# Patient Record
Sex: Female | Born: 1970 | Race: Black or African American | Hispanic: No | Marital: Single | Smoking: Never smoker
Health system: Southern US, Community
[De-identification: ages and names within clinical notes are randomized; demographics above are authoritative.]

---

## 1997-05-19 ENCOUNTER — Inpatient Hospital Stay (HOSPITAL_COMMUNITY): Admission: AD | Admit: 1997-05-19 | Discharge: 1997-05-21 | Payer: Self-pay | Admitting: Obstetrics and Gynecology

## 1997-09-28 ENCOUNTER — Encounter: Admission: RE | Admit: 1997-09-28 | Discharge: 1997-09-28 | Payer: Self-pay | Admitting: Family Medicine

## 1997-11-06 ENCOUNTER — Encounter: Admission: RE | Admit: 1997-11-06 | Discharge: 1997-11-06 | Payer: Self-pay | Admitting: Sports Medicine

## 1997-12-20 ENCOUNTER — Encounter: Admission: RE | Admit: 1997-12-20 | Discharge: 1997-12-20 | Payer: Self-pay | Admitting: Family Medicine

## 1998-01-21 ENCOUNTER — Encounter: Admission: RE | Admit: 1998-01-21 | Discharge: 1998-01-21 | Payer: Self-pay | Admitting: Family Medicine

## 1998-10-18 ENCOUNTER — Encounter: Admission: RE | Admit: 1998-10-18 | Discharge: 1998-10-18 | Payer: Self-pay | Admitting: Family Medicine

## 1998-10-18 ENCOUNTER — Other Ambulatory Visit: Admission: RE | Admit: 1998-10-18 | Discharge: 1998-10-18 | Payer: Self-pay

## 1998-10-24 ENCOUNTER — Encounter: Admission: RE | Admit: 1998-10-24 | Discharge: 1998-10-24 | Payer: Self-pay | Admitting: Family Medicine

## 1998-10-25 ENCOUNTER — Encounter: Admission: RE | Admit: 1998-10-25 | Discharge: 1998-10-25 | Payer: Self-pay | Admitting: Family Medicine

## 1998-10-30 ENCOUNTER — Encounter: Admission: RE | Admit: 1998-10-30 | Discharge: 1998-10-30 | Payer: Self-pay | Admitting: Family Medicine

## 1998-10-31 ENCOUNTER — Encounter: Admission: RE | Admit: 1998-10-31 | Discharge: 1998-10-31 | Payer: Self-pay | Admitting: Family Medicine

## 1999-02-10 ENCOUNTER — Encounter: Admission: RE | Admit: 1999-02-10 | Discharge: 1999-02-10 | Payer: Self-pay | Admitting: Family Medicine

## 1999-02-20 ENCOUNTER — Encounter: Admission: RE | Admit: 1999-02-20 | Discharge: 1999-02-20 | Payer: Self-pay | Admitting: Family Medicine

## 1999-04-17 ENCOUNTER — Encounter: Admission: RE | Admit: 1999-04-17 | Discharge: 1999-04-17 | Payer: Self-pay | Admitting: Family Medicine

## 1999-04-29 ENCOUNTER — Encounter: Admission: RE | Admit: 1999-04-29 | Discharge: 1999-04-29 | Payer: Self-pay | Admitting: Sports Medicine

## 1999-05-16 ENCOUNTER — Encounter: Admission: RE | Admit: 1999-05-16 | Discharge: 1999-05-16 | Payer: Self-pay | Admitting: Family Medicine

## 1999-05-19 ENCOUNTER — Encounter: Admission: RE | Admit: 1999-05-19 | Discharge: 1999-05-19 | Payer: Self-pay | Admitting: Family Medicine

## 1999-06-03 ENCOUNTER — Encounter: Admission: RE | Admit: 1999-06-03 | Discharge: 1999-06-03 | Payer: Self-pay | Admitting: Sports Medicine

## 2003-12-13 ENCOUNTER — Inpatient Hospital Stay (HOSPITAL_COMMUNITY): Admission: AD | Admit: 2003-12-13 | Discharge: 2003-12-13 | Payer: Self-pay | Admitting: Obstetrics & Gynecology

## 2003-12-20 ENCOUNTER — Inpatient Hospital Stay (HOSPITAL_COMMUNITY): Admission: AD | Admit: 2003-12-20 | Discharge: 2003-12-20 | Payer: Self-pay | Admitting: *Deleted

## 2008-05-15 ENCOUNTER — Ambulatory Visit: Payer: Self-pay | Admitting: Family Medicine

## 2008-05-15 ENCOUNTER — Encounter: Payer: Self-pay | Admitting: Family Medicine

## 2008-05-15 LAB — CONVERTED CEMR LAB
Antibody Screen: NEGATIVE
Band Neutrophils: 0 % (ref 0–10)
Basophils Absolute: 0 10*3/uL (ref 0.0–0.1)
Basophils Relative: 0 % (ref 0–1)
Beta hcg, urine, semiquantitative: POSITIVE
Eosinophils Absolute: 0.1 10*3/uL (ref 0.0–0.7)
Eosinophils Relative: 1 % (ref 0–5)
HCT: 38.2 % (ref 36.0–46.0)
Hemoglobin: 12.8 g/dL (ref 12.0–15.0)
Hepatitis B Surface Ag: NEGATIVE
Lymphocytes Relative: 27 % (ref 12–46)
Lymphs Abs: 1.6 10*3/uL (ref 0.7–4.0)
MCHC: 33.5 g/dL (ref 30.0–36.0)
MCV: 87.2 fL (ref 78.0–100.0)
Monocytes Absolute: 0.5 10*3/uL (ref 0.1–1.0)
Monocytes Relative: 9 % (ref 3–12)
Neutro Abs: 3.7 10*3/uL (ref 1.7–7.7)
Neutrophils Relative %: 63 % (ref 43–77)
Platelets: 220 10*3/uL (ref 150–400)
RBC: 4.38 M/uL (ref 3.87–5.11)
RDW: 12.4 % (ref 11.5–15.5)
Rh Type: NEGATIVE
Rubella: 49.9 intl units/mL — ABNORMAL HIGH
Sickle Cell Screen: NEGATIVE
WBC: 5.8 10*3/uL (ref 4.0–10.5)

## 2008-05-16 ENCOUNTER — Ambulatory Visit: Payer: Self-pay | Admitting: Physician Assistant

## 2008-05-16 ENCOUNTER — Encounter: Payer: Self-pay | Admitting: Family Medicine

## 2008-05-16 ENCOUNTER — Inpatient Hospital Stay (HOSPITAL_COMMUNITY): Admission: AD | Admit: 2008-05-16 | Discharge: 2008-05-16 | Payer: Self-pay | Admitting: Obstetrics & Gynecology

## 2008-05-31 ENCOUNTER — Ambulatory Visit: Payer: Self-pay | Admitting: Family Medicine

## 2008-05-31 ENCOUNTER — Encounter: Payer: Self-pay | Admitting: Family Medicine

## 2008-06-01 ENCOUNTER — Ambulatory Visit (HOSPITAL_COMMUNITY): Admission: RE | Admit: 2008-06-01 | Discharge: 2008-06-01 | Payer: Self-pay | Admitting: Family Medicine

## 2008-06-01 ENCOUNTER — Encounter: Payer: Self-pay | Admitting: Family Medicine

## 2008-06-03 ENCOUNTER — Telehealth: Payer: Self-pay | Admitting: Family Medicine

## 2008-06-03 LAB — CONVERTED CEMR LAB: hCG, Beta Chain, Quant, S: 136345.3 milliintl units/mL

## 2008-06-06 ENCOUNTER — Telehealth: Payer: Self-pay | Admitting: Family Medicine

## 2008-06-18 ENCOUNTER — Encounter: Payer: Self-pay | Admitting: Family Medicine

## 2008-07-04 ENCOUNTER — Encounter: Payer: Self-pay | Admitting: Family Medicine

## 2008-07-04 DIAGNOSIS — O360990 Maternal care for other rhesus isoimmunization, unspecified trimester, not applicable or unspecified: Secondary | ICD-10-CM | POA: Insufficient documentation

## 2008-07-05 ENCOUNTER — Encounter: Payer: Self-pay | Admitting: Family Medicine

## 2008-07-05 ENCOUNTER — Ambulatory Visit: Payer: Self-pay | Admitting: Family Medicine

## 2008-07-24 ENCOUNTER — Telehealth (INDEPENDENT_AMBULATORY_CARE_PROVIDER_SITE_OTHER): Payer: Self-pay | Admitting: *Deleted

## 2008-07-27 ENCOUNTER — Encounter: Payer: Self-pay | Admitting: Family Medicine

## 2008-07-27 ENCOUNTER — Ambulatory Visit (HOSPITAL_COMMUNITY): Admission: RE | Admit: 2008-07-27 | Discharge: 2008-07-27 | Payer: Self-pay | Admitting: Family Medicine

## 2008-07-30 ENCOUNTER — Telehealth: Payer: Self-pay | Admitting: Family Medicine

## 2008-07-30 ENCOUNTER — Inpatient Hospital Stay (HOSPITAL_COMMUNITY): Admission: AD | Admit: 2008-07-30 | Discharge: 2008-07-30 | Payer: Self-pay | Admitting: Obstetrics & Gynecology

## 2008-08-02 ENCOUNTER — Ambulatory Visit: Payer: Self-pay | Admitting: Family Medicine

## 2008-08-21 ENCOUNTER — Telehealth: Payer: Self-pay | Admitting: Family Medicine

## 2008-08-22 ENCOUNTER — Telehealth: Payer: Self-pay | Admitting: Family Medicine

## 2008-08-22 ENCOUNTER — Encounter (INDEPENDENT_AMBULATORY_CARE_PROVIDER_SITE_OTHER): Payer: Self-pay | Admitting: Family Medicine

## 2008-08-22 ENCOUNTER — Ambulatory Visit: Payer: Self-pay | Admitting: Family Medicine

## 2008-08-22 ENCOUNTER — Encounter: Payer: Self-pay | Admitting: Family Medicine

## 2008-08-22 LAB — CONVERTED CEMR LAB
Antibody Screen: NEGATIVE
Whiff Test: NEGATIVE

## 2008-08-30 ENCOUNTER — Ambulatory Visit: Payer: Self-pay | Admitting: Family Medicine

## 2008-09-07 ENCOUNTER — Encounter: Payer: Self-pay | Admitting: Family Medicine

## 2008-09-27 ENCOUNTER — Ambulatory Visit: Payer: Self-pay | Admitting: Family Medicine

## 2008-10-05 ENCOUNTER — Encounter: Payer: Self-pay | Admitting: Family Medicine

## 2008-10-20 ENCOUNTER — Inpatient Hospital Stay (HOSPITAL_COMMUNITY): Admission: AD | Admit: 2008-10-20 | Discharge: 2008-10-20 | Payer: Self-pay | Admitting: Obstetrics & Gynecology

## 2008-10-24 ENCOUNTER — Ambulatory Visit: Payer: Self-pay | Admitting: Family Medicine

## 2008-10-24 ENCOUNTER — Encounter: Payer: Self-pay | Admitting: Family Medicine

## 2008-10-24 LAB — CONVERTED CEMR LAB: Whiff Test: NEGATIVE

## 2008-11-08 ENCOUNTER — Encounter: Payer: Self-pay | Admitting: Family Medicine

## 2008-11-08 ENCOUNTER — Ambulatory Visit: Payer: Self-pay | Admitting: Family Medicine

## 2008-11-08 ENCOUNTER — Encounter: Payer: Self-pay | Admitting: *Deleted

## 2008-11-09 ENCOUNTER — Encounter: Payer: Self-pay | Admitting: Family Medicine

## 2008-11-09 ENCOUNTER — Ambulatory Visit (HOSPITAL_COMMUNITY): Admission: RE | Admit: 2008-11-09 | Discharge: 2008-11-09 | Payer: Self-pay | Admitting: Family Medicine

## 2008-11-22 ENCOUNTER — Ambulatory Visit: Payer: Self-pay | Admitting: Family Medicine

## 2008-11-23 ENCOUNTER — Encounter: Payer: Self-pay | Admitting: Family Medicine

## 2008-11-26 ENCOUNTER — Ambulatory Visit: Payer: Self-pay | Admitting: Family Medicine

## 2008-11-26 DIAGNOSIS — Z22330 Carrier of Group B streptococcus: Secondary | ICD-10-CM | POA: Insufficient documentation

## 2008-11-26 DIAGNOSIS — M5430 Sciatica, unspecified side: Secondary | ICD-10-CM | POA: Insufficient documentation

## 2008-11-26 LAB — CONVERTED CEMR LAB
Bilirubin Urine: NEGATIVE
Blood in Urine, dipstick: NEGATIVE
Glucose, Urine, Semiquant: NEGATIVE
Ketones, urine, test strip: NEGATIVE
Nitrite: NEGATIVE
Protein, U semiquant: NEGATIVE
Specific Gravity, Urine: 1.01
Urobilinogen, UA: 0.2
WBC Urine, dipstick: NEGATIVE
pH: 7

## 2008-12-06 ENCOUNTER — Ambulatory Visit: Payer: Self-pay | Admitting: Family Medicine

## 2008-12-09 ENCOUNTER — Inpatient Hospital Stay (HOSPITAL_COMMUNITY): Admission: AD | Admit: 2008-12-09 | Discharge: 2008-12-09 | Payer: Self-pay | Admitting: Obstetrics & Gynecology

## 2008-12-11 ENCOUNTER — Ambulatory Visit: Payer: Self-pay | Admitting: Family Medicine

## 2008-12-19 ENCOUNTER — Inpatient Hospital Stay (HOSPITAL_COMMUNITY): Admission: AD | Admit: 2008-12-19 | Discharge: 2008-12-21 | Payer: Self-pay | Admitting: Obstetrics & Gynecology

## 2008-12-19 ENCOUNTER — Telehealth: Payer: Self-pay | Admitting: Sports Medicine

## 2008-12-19 ENCOUNTER — Ambulatory Visit: Payer: Self-pay | Admitting: Advanced Practice Midwife

## 2009-01-07 ENCOUNTER — Ambulatory Visit: Payer: Self-pay | Admitting: Family Medicine

## 2009-01-07 ENCOUNTER — Telehealth: Payer: Self-pay | Admitting: Family Medicine

## 2009-01-07 LAB — CONVERTED CEMR LAB: Rapid Strep: NEGATIVE

## 2009-01-25 ENCOUNTER — Encounter: Payer: Self-pay | Admitting: Family Medicine

## 2009-01-25 ENCOUNTER — Ambulatory Visit: Payer: Self-pay | Admitting: Family Medicine

## 2009-01-25 DIAGNOSIS — M898X9 Other specified disorders of bone, unspecified site: Secondary | ICD-10-CM | POA: Insufficient documentation

## 2009-01-25 LAB — CONVERTED CEMR LAB: Beta hcg, urine, semiquantitative: NEGATIVE

## 2009-03-11 ENCOUNTER — Encounter (INDEPENDENT_AMBULATORY_CARE_PROVIDER_SITE_OTHER): Payer: Self-pay | Admitting: *Deleted

## 2009-03-14 ENCOUNTER — Telehealth: Payer: Self-pay | Admitting: Family Medicine

## 2009-03-15 ENCOUNTER — Encounter: Payer: Self-pay | Admitting: Family Medicine

## 2009-03-18 ENCOUNTER — Ambulatory Visit: Payer: Self-pay | Admitting: Family Medicine

## 2009-03-22 ENCOUNTER — Ambulatory Visit: Payer: Self-pay | Admitting: Family Medicine

## 2009-05-07 ENCOUNTER — Telehealth: Payer: Self-pay | Admitting: Family Medicine

## 2009-08-01 ENCOUNTER — Ambulatory Visit: Payer: Self-pay | Admitting: Family Medicine

## 2009-10-07 LAB — CONVERTED CEMR LAB
HCT: 40.8 % (ref 36.0–46.0)
Hemoglobin: 13.6 g/dL (ref 12.0–15.0)
MCHC: 33.3 g/dL (ref 30.0–36.0)
MCV: 89.7 fL (ref 78.0–100.0)
Platelets: 249 10*3/uL (ref 150–400)
RBC: 4.55 M/uL (ref 3.87–5.11)
RDW: 12.5 % (ref 11.5–15.5)
TSH: 1.14 microintl units/mL (ref 0.350–4.500)
WBC: 4.1 10*3/uL (ref 4.0–10.5)

## 2010-03-04 ENCOUNTER — Encounter: Payer: Self-pay | Admitting: Family Medicine

## 2010-04-08 NOTE — Progress Notes (Signed)
Summary: triage  Phone Note Call from Patient Call back at Home Phone [phone]   Caller: Patient Summary of Call: Thinks she has thrush on her breast. She breast feeds. Initial call taken by: Clydell Hakim,  March 14, 2009 10:26 AM  Follow-up for Phone Call        states her nipples &  breasts itch & thinks she has thrush. the area is irritated & she wants to know if she can use the nystatin that was rx for her dtr's thrush. told her I will check & call her back Follow-up by: Golden Circle RN,  March 14, 2009 10:30 AM  Additional Follow-up for Phone Call Additional follow up Details #1::        child is 10 weeks old & latches well. this is mom's 4th child.   per Dr. Mauricio Po, she may use the nystatin. if no improvement in 1-2 days, call for appt as there are other things that may be causing the problem. mom agrees Additional Follow-up by: Golden Circle RN,  March 14, 2009 10:34 AM

## 2010-04-08 NOTE — Assessment & Plan Note (Signed)
Summary: rash all over,tcb   Vital Signs:  Patient profile:   40 year old female Weight:      173 pounds Temp:     98.4 degrees F oral Pulse rate:   71 / minute BP sitting:   113 / 64  (right arm) Cuff size:   regular  Vitals Entered By: Tessie Fass CMA (March 22, 2009 11:38 AM) CC: body rash, itching Is Patient Diabetic? No Pain Assessment Patient in pain? no        Primary Care Provider:  Milinda Antis MD  CC:  body rash and itching.  History of Present Illness: Cynthia Greene comes in today for itching.  She was recently treated for candidiasis of her breast.  Her daughter, whom she is breastfeeding, had oral candidiasis.  Both are being treated now.  The yeast on her breast is better.  Now just struggling with the cracking and dryness.  The lanolin made her itch worse so she is using eucerin.  Also itching on her face now and inner thighs.  Has red bumps in these areas.  Feels "itchy" all over but no bumps anywhere else.  Worried she is "infested" with yeast.  No fevers.  No further pus on breasts.  Habits & Providers  Alcohol-Tobacco-Diet     Tobacco Status: never  Allergies: No Known Drug Allergies  Physical Exam  General:  Well-developed, well-nourished, in no acute distress; alert, appropriate and cooperative throughout examination. Vitals reviewed. Breasts:   dry /flaky skin aroud nipple.  breast non-tender to palpation no fluctuant area.  Mild erythema of left breast around 10 o'clock but no warmth or pain.  Is itchy to patient Skin:  small bumps around eyes on face.  small erythematous bumps on inner upper thighs with evidence of excoriation.   Impression & Recommendations:  Problem # 1:  CANDIDIASIS, SKIN (ICD-112.3) Assessment Improved  Improving on breast.  CAn continue topical nystatin and eucerin.  Do not feel rash on thighs or face is yeast.  Unclear etiology.  Appears like nonspecific dermatitis.  Will try triamcinolone cream to see if this helps.   Patient feels (and I agree) may be some psychological aspect to the itching since she has been so worried about the yeast.  Overall though, endorses all rashes and itching seem to be improving.   Orders: [facility]- Est Level  3 (78295)  Complete Medication List: 1)  Multivitamins Tabs (Multiple vitamin) .Marland Kitchen.. 1 chewable mvi daily 2)  Folic Acid 400 Mcg Tabs (Folic acid) .Marland Kitchen.. 1 by mouth daily 3)  P D Natal Vitamins/folic Acid Tabs (Prenatal multivit-min-fe-fa) .... Take 1 daily during the pregnancy and while breastfeeding 4)  Hydrocortisone 2.5 % Crea (Hydrocortisone) .... Apply to affected areas three times a day as needed 5)  Vicodin 5-500 Mg Tabs (Hydrocodone-acetaminophen) .... Take one by mouth q6 hours as needed headache 6)  Lansinoh/breastfeeding Mothers Oint (Lanolin) .... Apply to affected areas on breast twice a day  dispense 1 large bottle 7)  Triamcinolone Acetonide 0.5 % Crea (Triamcinolone acetonide) .... Apply to affected areas on body (not face) twice daily for itching or irritation disp: 80g Prescriptions: TRIAMCINOLONE ACETONIDE 0.5 % CREA (TRIAMCINOLONE ACETONIDE) apply to affected areas on body (not face) twice daily for itching or irritation disp: 80g  #1 x 3   Entered and Authorized by:   Ardeen Garland  MD   Signed by:   Ardeen Garland  MD on 03/22/2009   Method used:   [facility] to  [facility]  [address] [id number]       [address]       [place], [place]  [address]       Ph: [phone]       Fax: [phone]   RxID:   [id number]

## 2010-04-08 NOTE — Miscellaneous (Signed)
Summary: pus on nipples  Clinical Lists Changes states she has been using the nystatin but they hurt & now has pus coming our from areola. advised UC tonight. we close in a few minutes & cannot see her. she was ok with this.Golden Circle RN  March 15, 2009 4:45 PM

## 2010-04-08 NOTE — Progress Notes (Signed)
Summary: phn msg  Phone Note Call from Patient Call back at Home Phone [phone]   Caller: Patient Summary of Call: Wants to talk to Dr. [place] about a medical issue that they had discussed before. Initial call taken by: Clydell Hakim,  May 07, 2009 9:15 AM  Follow-up for Phone Call        fwd. to dr.jordan Follow-up by: Arlyss Repress CMA,,  May 07, 2009 12:11 PM  Additional Follow-up for Phone Call Additional follow up Details #1::        Pt's grandmother has lung cancer - pt wanted to discuss treatment options. Additional Follow-up by: Sarah [place] MD,  May 08, 2009 8:36 AM

## 2010-04-08 NOTE — Miscellaneous (Signed)
Summary: [name]  Clinical Lists Changes  Problems: Added new problem of [name] (ICD-305.1) 

## 2010-04-08 NOTE — Assessment & Plan Note (Signed)
Summary: having OB prob,df   Vital Signs:  Patient profile:   40 year old female Weight:      171 pounds Temp:     98.5 degrees F oral Pulse rate:   91 / minute Pulse rhythm:   regular BP sitting:   109 / 73  (right arm)  Vitals Entered By: Modesta Messing LPN (November 26, 2008 11:13 AM) CC: c/o pressure and pain in leg. Is Patient Diabetic? No   Primary Care Provider:  Milinda Antis MD  CC:  c/o pressure and pain in leg.Marland Kitchen  History of Present Illness: 40 yo multip at 35/[redacted] weeks GA, with PMHx of preterm labor and one preterm delivery at 36 weeks, presents with c/o (1) contractions, and (2) sacral pain with burning and shooting pain down the left leg.   1. Contractions: x 5 months?, unchanged, pt with history of pre-term labor req tocolytics and 1 preterm delivery,  declined 17-P. denies bleeding or fluid from vagina.   2. Sacral pain with burning and shooting pain down back of left leg. x 6 weeks, but much worse last night, sleeps on side (whichever is more comfortable) with bobby body pillow. no bowel or bladder incontinence or saddle anesthesia. does not want medication.  Note: starting wearing new shoes about 1 month ago that are designed to strengthen hamstrings and glutes. she admits to walking a lot yesterday.   Habits & Providers  Alcohol-Tobacco-Diet     Alcohol drinks/day: 0     Tobacco Status: never     Cigarette Packs/Day: n/a  Current Medications (verified): 1)  Multivitamins  Tabs (Multiple Vitamin) .Marland Kitchen.. 1 Chewable Mvi Daily 2)  Folic Acid 400 Mcg Tabs (Folic Acid) .Marland Kitchen.. 1 By Mouth Daily 3)  P D Natal Vitamins/folic Acid  Tabs (Prenatal Multivit-Min-Fe-Fa) .... Take 1 Daily During The Pregnancy and While Breastfeeding 4)  Hydrocortisone 2.5 % Crea (Hydrocortisone) .... Apply To Affected Areas Three Times A Day As Needed  Allergies (verified): No Known Drug Allergies  Past History:  Social History: Last updated: 05/31/2008 Lives with 3 children, Current  FOB not father of other children. Works as Social worker, attends [employer] Never Smoked Alcohol use-no Drug use-no Seatbelt use 100%  Review of Systems       per HPI, otherwise negative  Physical Exam  General:  Well-developed, well-nourished, in no acute distress; alert, appropriate and cooperative throughout examination. Vitals reviewed. Abdomen:  Gravid. Msk:  Left + SLR. Extremities:  no edema Neurologic:  no focal deficits Psych:  Oriented X3, memory intact for recent and remote, normally interactive, and good eye contact.     Impression & Recommendations:  Problem # 1:  SCIATICA (ICD-724.3) Assessment New  Discussed laxity of ligaments and extra weight on sacrum and pelvis during pregnancy. Instructed patient to lay on unaffected side as much as possible with pillow between knees. Stop wearing shoes designed to strengthen glutes since this could be piriformis syndrome. Tylenol for pain. NO RED FLAGS.   Orders: [facility]- Est Level  3 (16109)  Problem # 2:  PREGNANCY, MULTIGRAVIDA (ICD-V22.1) Assessment: Unchanged  Appropriate growth; intermittent unsustained contractions, no change in cervix.   Orders: [facility]- Est Level  3 (60454)  Complete Medication List: 1)  Multivitamins Tabs (Multiple vitamin) .Marland Kitchen.. 1 chewable mvi daily 2)  Folic Acid 400 Mcg Tabs (Folic acid) .Marland Kitchen.. 1 by mouth daily 3)  P D Natal Vitamins/folic Acid Tabs (Prenatal multivit-min-fe-fa) .... Take 1 daily during the pregnancy and while breastfeeding 4)  Hydrocortisone  2.5 % Crea (Hydrocortisone) .... Apply to affected areas three times a day as needed  Other Orders: Urinalysis-[facility] (00000)  Patient Instructions: 1)  It was nice to meet you today! 2)  Today, your baby has a normal heart rate and your cervix has not changed. 3)  For your sciatica, lay on right side with pillow between your legs. It is safe to take Tylenol for pain.  Laboratory Results   Urine Tests  Date/Time Received: November 26, 2008 12:11 PM  Date/Time Reported: November 26, 2008 12:17 PM   Routine Urinalysis   Color: yellow Appearance: Clear Glucose: negative   (Normal Range: Negative) Bilirubin: negative   (Normal Range: Negative) Ketone: negative   (Normal Range: Negative) Spec. Gravity: 1.010   (Normal Range: 1.003-1.035) Blood: negative   (Normal Range: Negative) pH: 7.0   (Normal Range: 5.0-8.0) Protein: negative   (Normal Range: Negative) Urobilinogen: 0.2   (Normal Range: 0-1) Nitrite: negative   (Normal Range: Negative) Leukocyte Esterace: negative   (Normal Range: Negative)    Comments: ...............test performed by......Marland KitchenBonnie A. [place], [place] (ASCP)       Flowsheet View for Follow-up Visit    Estimated weeks of       gestation:     35 6/7    Weight:     171    Blood pressure:   109 / 73    Urine protein:       negative    Urine glucose:    negative    Urine nitrite:     negative    Hx headache?     No    Nausea/vomiting?   No    Edema?     0    Bleeding?     no    Leakage/discharge?   no    Fetal activity:       yes    Labor symptoms?   few ctx    Fundal height:      35    FHR:       140s    Fetal position:      vertex    Cx dilation:     FT    Cx effacement:   0    Fetal station:     high    Taking Vitamins?   Y    Smoking PPD:   n/a    Comment:     Some ctx, + sciatic pain    Next visit:     1 wk    Resident:     EW    Preceptor:     Jennette Kettle

## 2010-04-08 NOTE — Progress Notes (Signed)
 Summary: Emergency Line Call  Phone Note Call from Patient Call back at Home Phone [phone]   Caller: Patient Call For: Emergency Line Summary of Call: [redacted] week pregnant, feels that water broke just now, felt a gush of fluid that was clear, started contracting q5 mins, baby moving ok, no fevers/chills/abd pain, no vaginal bleeding, advised get to [facility] ASAP.  Pt knows where it is and will go immediately.  MAU resident to eval for ROM and call Dr. Bari if positive. Initial call taken by: Debby Petties MD,  December 19, 2008 4:24 AM

## 2010-04-08 NOTE — Assessment & Plan Note (Signed)
Summary: thrush on breast,tcb   Vital Signs:  Patient profile:   40 year old female Weight:      172 pounds Temp:     98.2 degrees F oral Pulse rate:   81 / minute BP sitting:   127 / 77  (right arm) Cuff size:   regular  Vitals Entered By: Tessie Fass CMA (March 18, 2009 2:08 PM) CC: thrush on breast Is Patient Diabetic? No   Primary Care Provider:  Milinda Antis MD  CC:  thrush on breast.  History of Present Illness:   40 y.o. approx 8 weeks post partrum presents with complaint of thrush on bilat. Breast. Infant recently treated for thrush as well. Called in last week and told to use the nystatin cream on the breast which she has been using, as well as air drying and neosporin. Redness has improved but breast ccontinue to itch and crack. Admits to small white bumps and dry flaky skin on the outer areola. occasional clear fluid from the cracks but no evidence of pus leaking from the nipple. Infant continues to feed well. ROS- denies fever, N/V  Habits & Providers  Alcohol-Tobacco-Diet     Tobacco Status: never  Current Medications (verified): 1)  Multivitamins  Tabs (Multiple Vitamin) .Marland Kitchen.. 1 Chewable Mvi Daily 2)  Folic Acid 400 Mcg Tabs (Folic Acid) .Marland Kitchen.. 1 By Mouth Daily 3)  P D Natal Vitamins/folic Acid  Tabs (Prenatal Multivit-Min-Fe-Fa) .... Take 1 Daily During The Pregnancy and While Breastfeeding 4)  Hydrocortisone 2.5 % Crea (Hydrocortisone) .... Apply To Affected Areas Three Times A Day As Needed 5)  Vicodin 5-500 Mg Tabs (Hydrocodone-Acetaminophen) .... Take One By Mouth Q6 Hours As Needed Headache 6)  Lansinoh/breastfeeding Mothers  Oint (Lanolin) .... Apply To Affected Areas On Breast Twice A Day  Dispense 1 Large Bottle  Allergies (verified): No Known Drug Allergies  Social History: Smoking Status:  never  Physical Exam  General:  Well-developed, well-nourished, in no acute distress; alert, appropriate and cooperative throughout examination. Vitals  reviewed. Breasts:  Minimal erythma bilat surrounding aerola, dry /flaky skin aroud nipple. Breast milk expressed bilat, no pus expressed, breast non-tender to palpation no fluctuant area, small raised papular lesions ? similar to [place] glands located 1cm outside of hyperpigmented areola region.  No adenopathy   Impression & Recommendations:  Problem # 1:  CANDIDIASIS, SKIN (ICD-112.3) Assessment New  Pt to continue Nystatin x 3 days, and start Lanolin for dry cracking nipples. Infant to continue Nystatin x 1 week as to not continue to reinfect mother Orders: [facility]- Est Level  3 (16109)  Problem # 2:  BREAST PAIN, BILATERAL (ICD-611.71) Assessment: New  Secondary to above and dry cracking skin on breast . No evidence of superinfection. Given red flags, if pain worsens or pus presents will need treatment for mastitis . Handout given on Thrush and breastfeeding  Orders: [facility]- Est Level  3 (60454)  Complete Medication List: 1)  Multivitamins Tabs (Multiple vitamin) .Marland Kitchen.. 1 chewable mvi daily 2)  Folic Acid 400 Mcg Tabs (Folic acid) .Marland Kitchen.. 1 by mouth daily 3)  P D Natal Vitamins/folic Acid Tabs (Prenatal multivit-min-fe-fa) .... Take 1 daily during the pregnancy and while breastfeeding 4)  Hydrocortisone 2.5 % Crea (Hydrocortisone) .... Apply to affected areas three times a day as needed 5)  Vicodin 5-500 Mg Tabs (Hydrocodone-acetaminophen) .... Take one by mouth q6 hours as needed headache 6)  Lansinoh/breastfeeding Mothers Oint (Lanolin) .... Apply to affected areas on breast twice a day  dispense 1 large bottle Prescriptions: LANSINOH/BREASTFEEDING MOTHERS  OINT (LANOLIN) apply to affected areas on breast twice a day  dispense 1 large bottle  #1 x 2   Entered and Authorized by:   Milinda Antis MD   Signed by:   Milinda Antis MD on 03/18/2009   Method used:   Print then Give to Patient   RxID:   [id number]

## 2010-04-08 NOTE — Assessment & Plan Note (Signed)
Summary: fluttering in chest,tcb   Vital Signs:  Patient profile:   40 year old female Weight:      187.6 pounds Pulse rate:   78 / minute BP sitting:   120 / 80  (right arm)  Vitals Entered By: Arlyss Repress CMA, (Aug 01, 2009 9:22 AM) CC: feels like heart skips a beat...feels tightness in chest and it takes her breath away, when she feels her chest getting tight. worse over the last weeks. Is Patient Diabetic? No Pain Assessment Patient in pain? no        Primary Care Provider:  Milinda Antis MD  CC:  feels like heart skips a beat...feels tightness in chest and it takes her breath away and when she feels her chest getting tight. worse over the last weeks.Marland Kitchen  History of Present Illness: Pt reports fluttering in chest within 1 month postpartum.  Now happens 1-2 times a week.  Feels she has to take a deep breath because her chest feels tight.  Episodes last seconds and are relieved with moving slower.  Pt notes 30 lb weight gain since before pregnancy and that BP is higher than in the past.  Swellingin toes  happened the other day after sitting for 2 hours.  No orthopnea or PND.  No caffeinated beverages, but lots of chocolate.  + craves sweets.   Active.  Walks a lot.  Feels winded pretty easily.  No tabacco.    Habits & Providers  Alcohol-Tobacco-Diet     Alcohol drinks/day: 0     Tobacco Status: never  Current Medications (verified): 1)  Multivitamins  Tabs (Multiple Vitamin) .Marland Kitchen.. 1 Chewable Mvi Daily  Allergies: No Known Drug Allergies  Review of Systems       see HPI  Physical Exam  General:  Well-developed,well-nourished,in no acute distress; alert,appropriate and cooperative throughout examination Lungs:  Normal respiratory effort, chest expands symmetrically. Lungs are clear to auscultation, no crackles or wheezes. Heart:  Normal rate and regular rhythm. S1 and S2 normal without gallop, murmur, click, rub or other extra sounds.   Impression &  Recommendations:  Problem # 1:  PALPITATIONS (ICD-785.1) Pt asymptomatic today.  EKG reviewed with NSR and no T-wave changes or ST changes.  May be due to pt's increased consumption of chocolate, stress and weight gain.  Encouraged trial of less chocolate.  Increase exercise.  F/u as needed. Orders: [facility]- Est Level  3 (16109)  Complete Medication List: 1)  Multivitamins Tabs (Multiple vitamin) .Marland Kitchen.. 1 chewable mvi daily  Other Orders: EKG- [facility] (EKG) TSH-[facility] ([id number]) CBC-[facility] (91478)  Patient Instructions: 1)  Things look good with your exam and EKG.  We will check some blood work today. 2)  Let us know if you feel worse.   3)  It was great to see you today

## 2010-04-10 NOTE — Miscellaneous (Signed)
  Clinical Lists Changes  Problems: Removed problem of PALPITATIONS (ICD-785.1) Removed problem of IRREGULAR HEART RATE (ICD-427.9) Removed problem of BREAST PAIN, BILATERAL (ICD-611.71) Removed problem of CANDIDIASIS, SKIN (ICD-112.3) Removed problem of POSTPARTUM EXAMINATION, NORMAL (ICD-V24.2) Removed problem of CONTRACEPTIVE MANAGEMENT (ICD-V25.09) Removed problem of HEADACHE (ICD-784.0) Removed problem of ABDOMINAL PAIN (ICD-789.00) Removed problem of CONTACT OR EXPOSURE TO OTHER VIRAL DISEASES (ICD-V01.79) Removed problem of VAGINAL BLEEDING IN PREGNANCY (ICD-641.90) Removed problem of VAGINAL DISCHARGE (ICD-623.5) Removed problem of PREGNANCY, MULTIGRAVIDA (ICD-V22.1) Removed problem of ADVANCED MATERNAL AGE (ICD-659.60) Removed problem of PREGNANT STATE, INCIDENTAL (ICD-V22.2) Removed problem of SCREENING FOR MALIGNANT NEOPLASM OF THE CERVIX (ICD-V76.2) Removed problem of SUPERVISION OF OTHER NORMAL PREGNANCY (ICD-V22.1)

## 2010-05-21 IMAGING — US US OB FOLLOW-UP
1 series · 14 of 28 positions shown · non-contrast
Comparison: none

OBSTETRICAL ULTRASOUND:
 This ultrasound exam was performed in the [HOSPITAL] Ultrasound Department.  The OB US report was generated in the AS system, and faxed to the ordering physician.  This report is also available in [REDACTED] PACS.

[Series 1: us ob follow up · 14 of 46 slices shown]
[im 2/46]
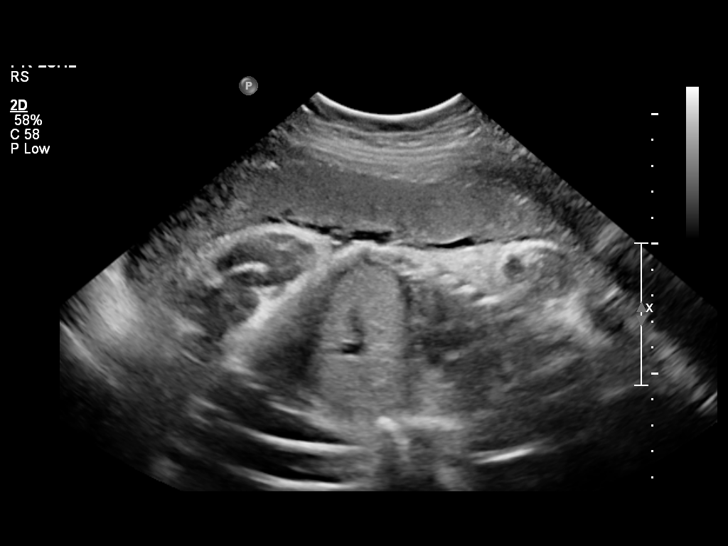
[im 6/46]
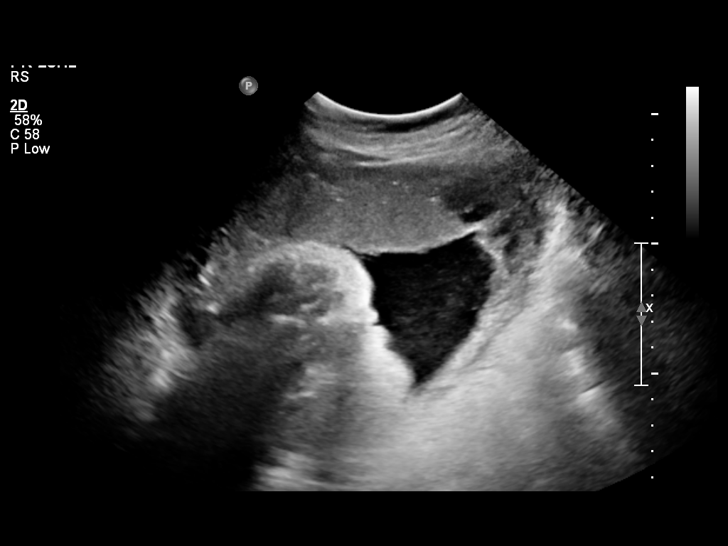
[im 9/46]
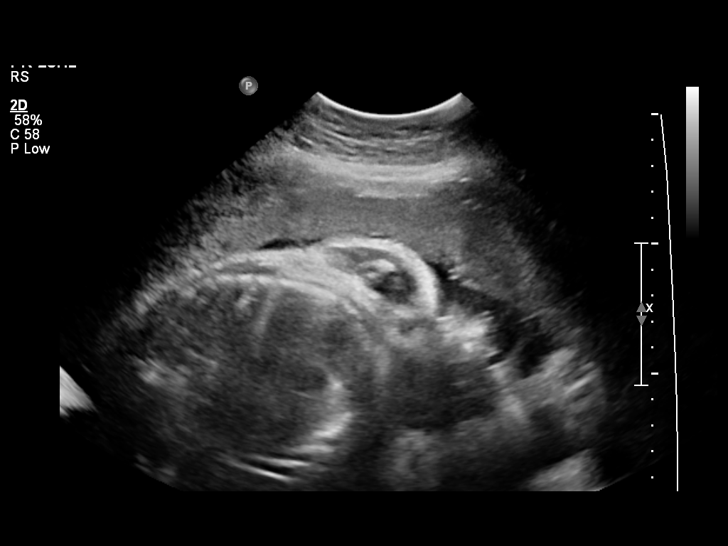
[im 12/46]
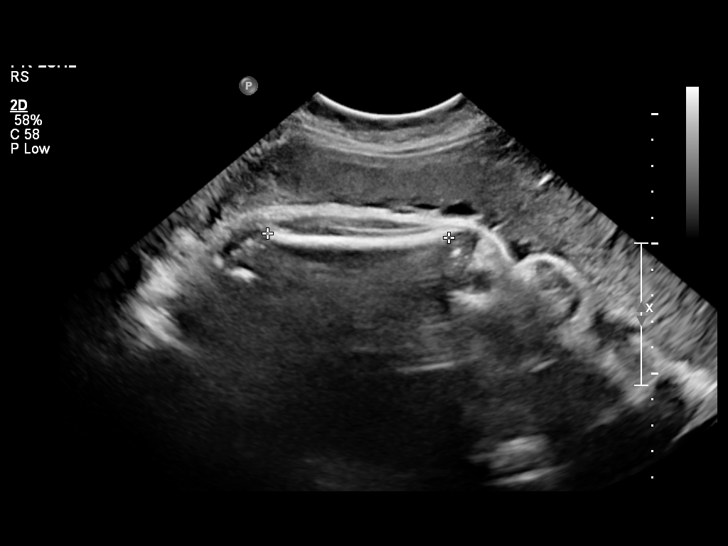
[im 16/46]
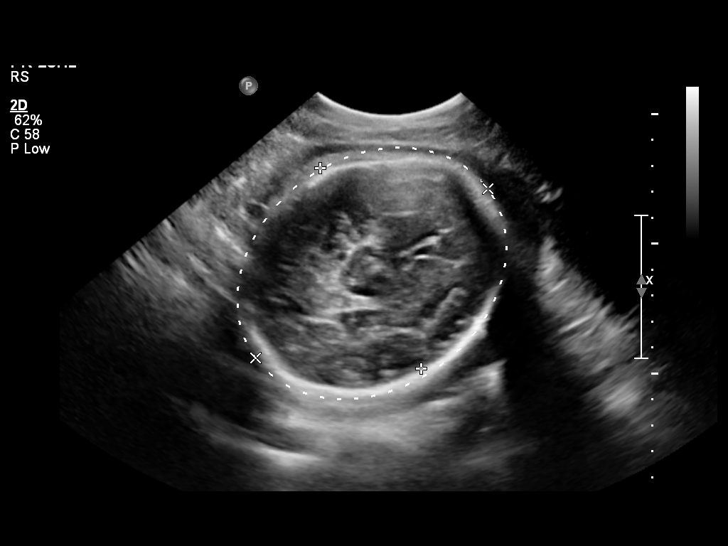
[im 19/46]
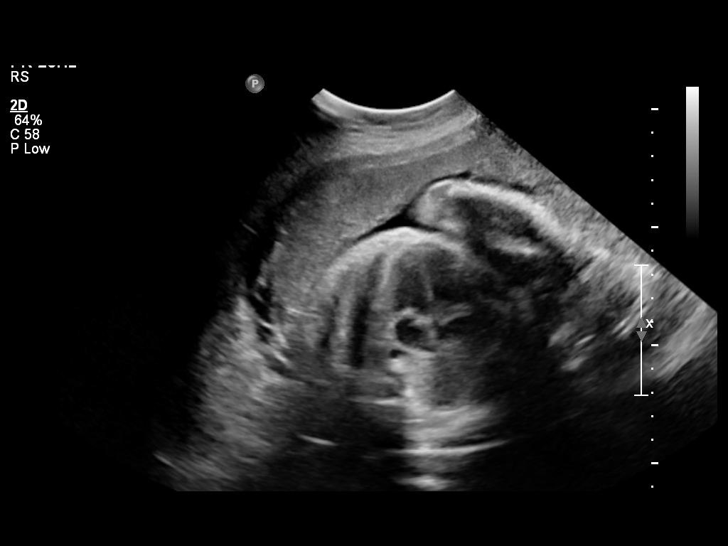
[im 22/46]
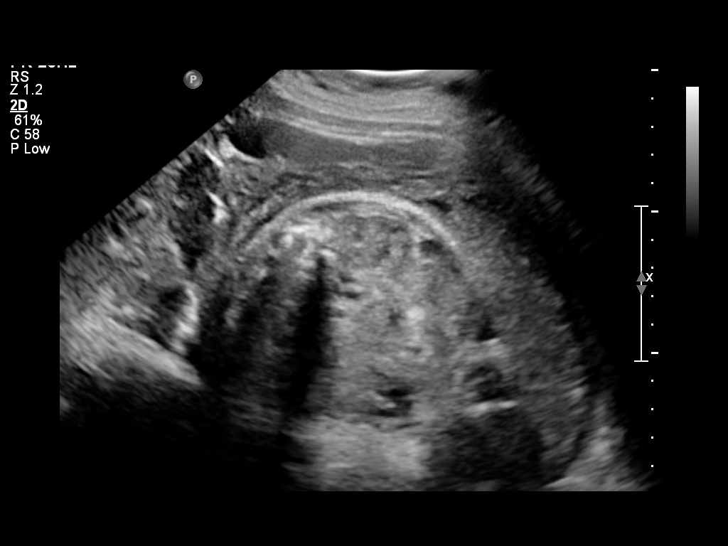
[im 26/46]
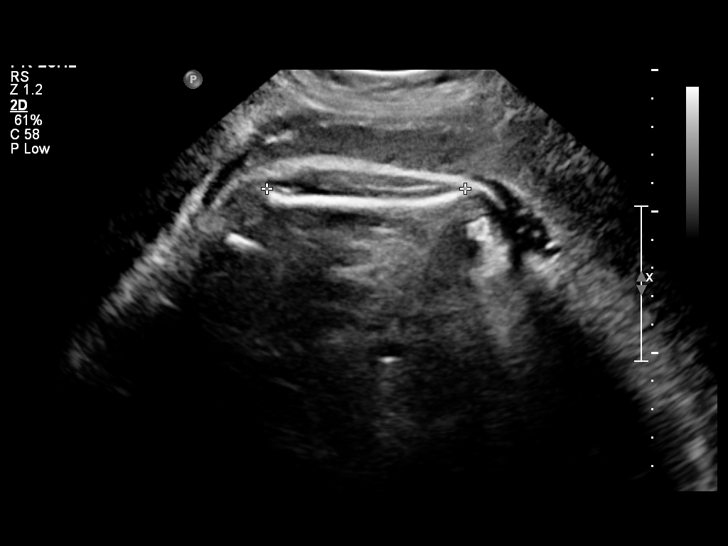
[im 29/46]
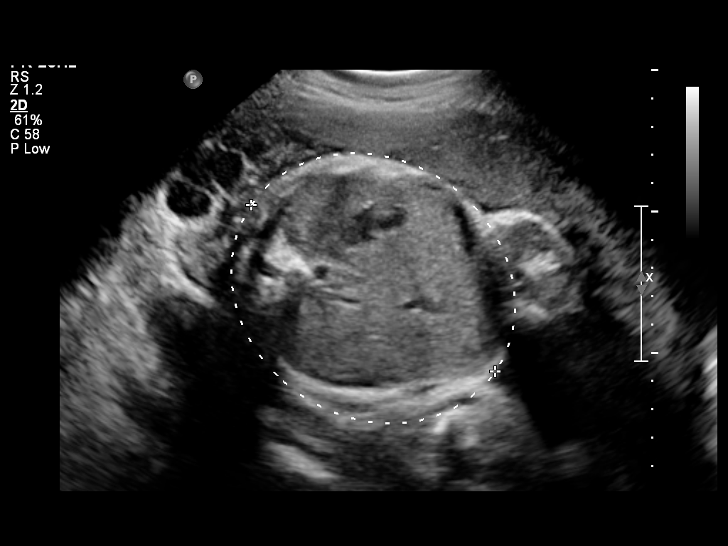
[im 32/46]
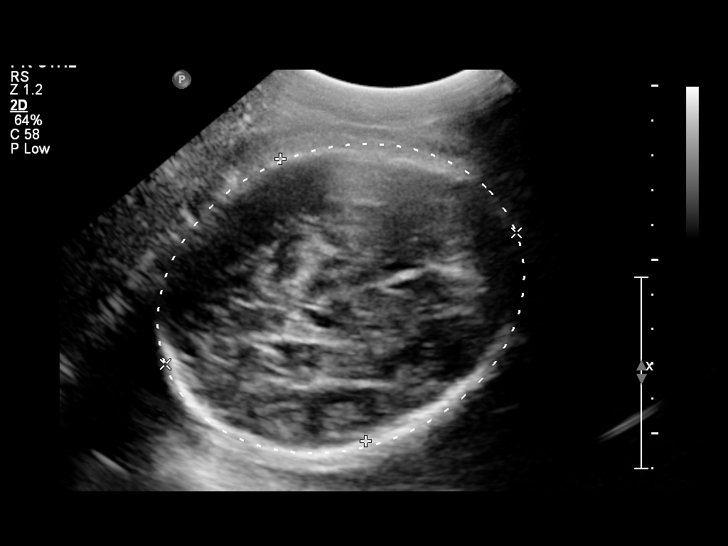
[im 36/46]
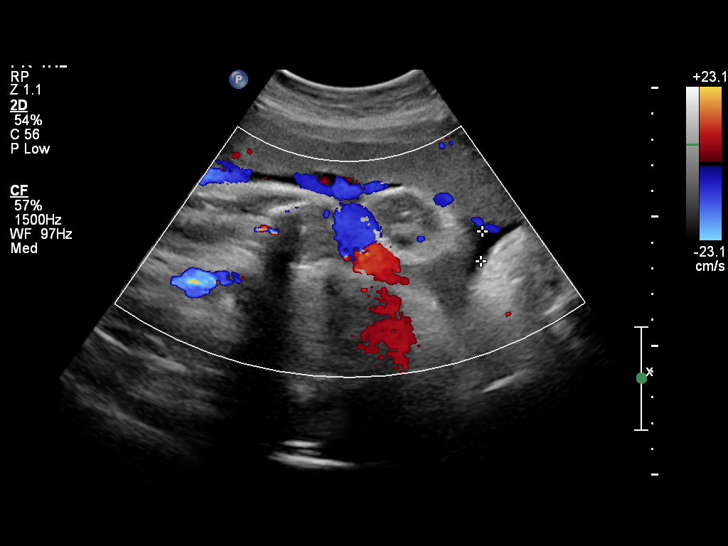
[im 39/46]
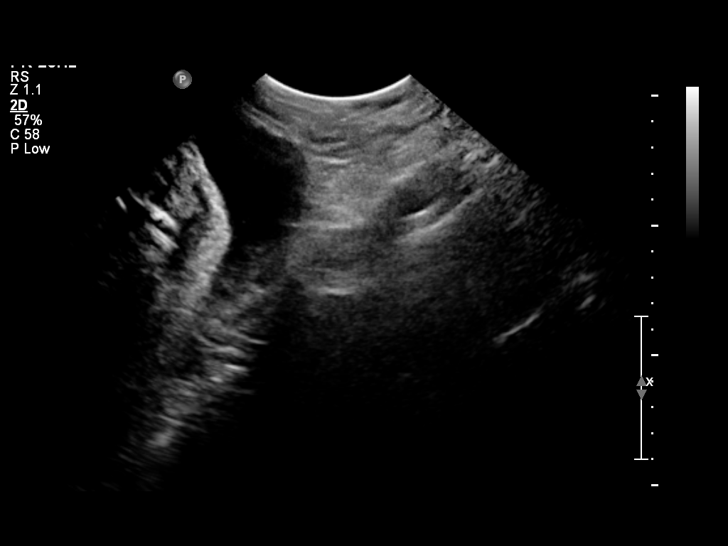
[im 42/46]
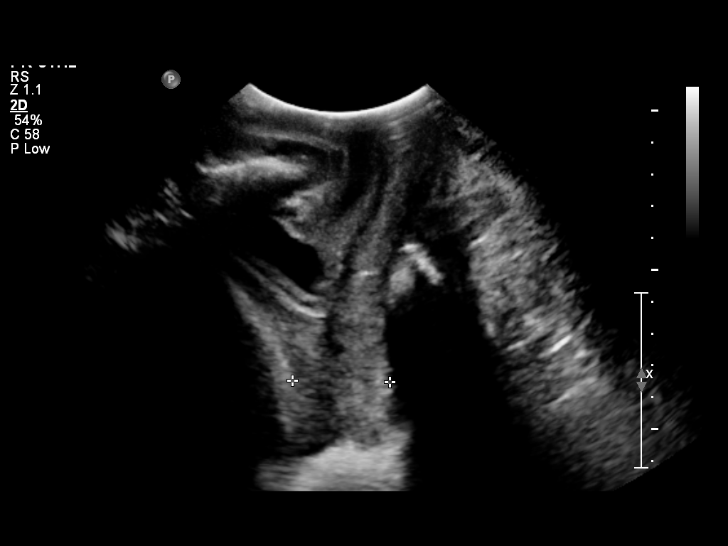
[im 46/46]
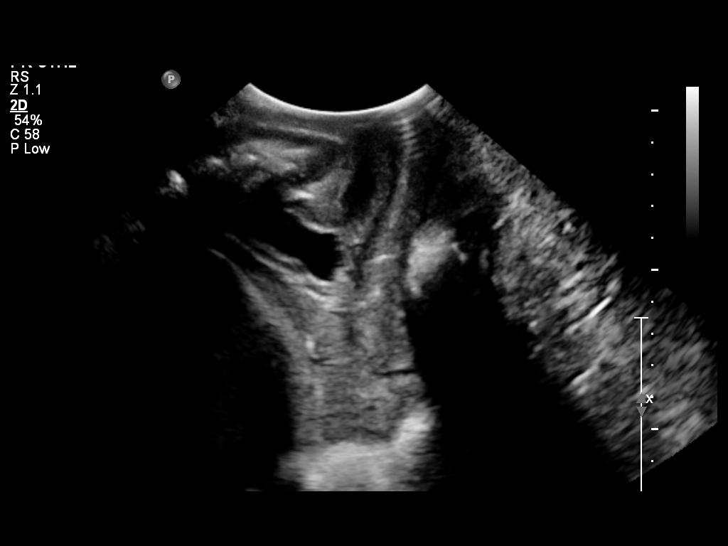

[14 of 28 positions shown; findings below may reference images not displayed]

IMPRESSION: See AS Obstetric US report.

## 2010-06-12 LAB — RH IMMUNE GLOB WKUP(>/=20WKS)(NOT WOMEN'S HOSP): Fetal Screen: NEGATIVE

## 2010-06-12 LAB — CBC
HCT: 32.7 % — ABNORMAL LOW (ref 36.0–46.0)
Hemoglobin: 11.1 g/dL — ABNORMAL LOW (ref 12.0–15.0)
MCHC: 34.1 g/dL (ref 30.0–36.0)
MCV: 90.6 fL (ref 78.0–100.0)
Platelets: 212 10*3/uL (ref 150–400)
RBC: 3.6 MIL/uL — ABNORMAL LOW (ref 3.87–5.11)
RDW: 13.4 % (ref 11.5–15.5)
WBC: 5.7 10*3/uL (ref 4.0–10.5)

## 2010-06-12 LAB — RPR: RPR Ser Ql: NONREACTIVE

## 2010-06-14 LAB — GC/CHLAMYDIA PROBE AMP, GENITAL
Chlamydia, DNA Probe: NEGATIVE
GC Probe Amp, Genital: NEGATIVE

## 2010-06-14 LAB — URINALYSIS, ROUTINE W REFLEX MICROSCOPIC
Bilirubin Urine: NEGATIVE
Glucose, UA: NEGATIVE mg/dL
Ketones, ur: NEGATIVE mg/dL
Leukocytes, UA: NEGATIVE
Nitrite: NEGATIVE
Protein, ur: NEGATIVE mg/dL
Specific Gravity, Urine: 1.015 (ref 1.005–1.030)
Urobilinogen, UA: 0.2 mg/dL (ref 0.0–1.0)
pH: 7.5 (ref 5.0–8.0)

## 2010-06-14 LAB — WET PREP, GENITAL
Clue Cells Wet Prep HPF POC: NONE SEEN
Trich, Wet Prep: NONE SEEN
Yeast Wet Prep HPF POC: NONE SEEN

## 2010-06-14 LAB — URINE MICROSCOPIC-ADD ON

## 2010-06-15 LAB — GLUCOSE, CAPILLARY: Glucose-Capillary: 134 mg/dL — ABNORMAL HIGH (ref 70–99)

## 2010-06-17 LAB — GC/CHLAMYDIA PROBE AMP, GENITAL
Chlamydia, DNA Probe: NEGATIVE
GC Probe Amp, Genital: NEGATIVE

## 2010-06-17 LAB — URINALYSIS, ROUTINE W REFLEX MICROSCOPIC
Ketones, ur: NEGATIVE mg/dL
Leukocytes, UA: NEGATIVE
Nitrite: NEGATIVE
pH: 6 (ref 5.0–8.0)

## 2010-06-17 LAB — WET PREP, GENITAL
Clue Cells Wet Prep HPF POC: NONE SEEN
Trich, Wet Prep: NONE SEEN
Yeast Wet Prep HPF POC: NONE SEEN

## 2010-06-17 LAB — URINE MICROSCOPIC-ADD ON

## 2010-06-19 LAB — URINALYSIS, ROUTINE W REFLEX MICROSCOPIC
Glucose, UA: NEGATIVE mg/dL
Ketones, ur: 15 mg/dL — AB
pH: 7 (ref 5.0–8.0)

## 2010-06-19 LAB — GC/CHLAMYDIA PROBE AMP, GENITAL
Chlamydia, DNA Probe: NEGATIVE
GC Probe Amp, Genital: NEGATIVE

## 2010-06-19 LAB — WET PREP, GENITAL
Trich, Wet Prep: NONE SEEN
Yeast Wet Prep HPF POC: NONE SEEN

## 2010-11-25 ENCOUNTER — Telehealth: Payer: Self-pay | Admitting: Family Medicine

## 2010-11-25 NOTE — Telephone Encounter (Signed)
Need to discuss switching providers.  Would like for her children as well as herself to be changed to Dr. Alvester Morin.  Please call her asap

## 2010-11-25 NOTE — Telephone Encounter (Signed)
Will route to Dr. Ashley Royalty to discuss reason for the change.  If unable to resolve, will discuss with Dr.'s Deirdre Priest and Pascal Lux.

## 2010-11-25 NOTE — Telephone Encounter (Signed)
Called and left message for her to call and leave times and numbers where I can contact her

## 2010-11-25 NOTE — Telephone Encounter (Signed)
Spoke with Cynthia Greene.  She would prefer if possible to have her children seen by an [place] American to serve as a role model since her children are in a single parent household.   She had no problems with any interactions with other physicians in our practice  This seems like reasonable request.  Will change her and her children to [name]

## 2010-12-11 ENCOUNTER — Telehealth: Payer: Self-pay | Admitting: Family Medicine

## 2010-12-11 MED ORDER — NYSTATIN 100000 UNIT/GM EX CREA: TOPICAL_CREAM | Freq: Two times a day (BID) | CUTANEOUS | Status: AC

## 2010-12-11 NOTE — Telephone Encounter (Signed)
Addended by: Everrett Coombe on: 12/11/2010 07:22 PM   Modules accepted: Orders

## 2010-12-11 NOTE — Telephone Encounter (Signed)
Mentioned having problems with yeast on breast during her child's physical.  Child without thrush, will send over Rx for nystatin as this is what she has used in the past and it has worked for her.

## 2010-12-11 NOTE — Telephone Encounter (Signed)
Need Nystatin cream called to [address] [facility].  Dr. Ashley Royalty was suppose to call this in yesterday.

## 2010-12-11 NOTE — Telephone Encounter (Signed)
Patient was in with her daughter yesterday and ask Dr. Ashley Royalty about this then. He had told her he would send in.  Paged Dr. Ashley Royalty.

## 2010-12-11 NOTE — Telephone Encounter (Signed)
Spoke with Dr. Ashley Royalty and he will send  Rx in.

## 2013-10-09 ENCOUNTER — Encounter: Payer: Self-pay | Admitting: Family Medicine

## 2013-10-09 ENCOUNTER — Ambulatory Visit (INDEPENDENT_AMBULATORY_CARE_PROVIDER_SITE_OTHER): Payer: Self-pay | Admitting: Family Medicine

## 2013-10-09 VITALS — BP 132/83 | HR 81 | Temp 99.4°F | Wt 179.1 lb

## 2013-10-09 DIAGNOSIS — M62838 Other muscle spasm: Secondary | ICD-10-CM | POA: Insufficient documentation

## 2013-10-09 DIAGNOSIS — R519 Headache, unspecified: Secondary | ICD-10-CM | POA: Insufficient documentation

## 2013-10-09 DIAGNOSIS — R51 Headache: Secondary | ICD-10-CM

## 2013-10-09 DIAGNOSIS — R42 Dizziness and giddiness: Secondary | ICD-10-CM

## 2013-10-09 LAB — GLUCOSE, CAPILLARY: GLUCOSE-CAPILLARY: 91 mg/dL (ref 70–99)

## 2013-10-09 NOTE — Assessment & Plan Note (Addendum)
Mild dizziness, gradual new onset today, likely due to limited PO intake and hydration today (did not eat a meal today)  Plan: 1. POCT CBG - 91 2. Recommend f/u PCP visit / New Patient eval for routine lab work and further assessment / discussion of patient's concerns for pre-diabetes

## 2013-10-09 NOTE — Patient Instructions (Signed)
Dear Rolan BuccoNethasia Kuchera, Thank you for coming in to clinic today. It was good to meet you!  Today we discussed your Headaches. 1. It sounds you had a migraine headache, likely triggered by being out in the sun, activity and stress. Also not eating today likely made your headache persist 2. We checked a finger stick blood sugar today. 3. For your headaches, work on the muscle techniques I demonstrated today, and continue Yoga, daily stretching, heating pad may help as well. Take Tylenol up to 500-1000mg  per dose up to 3 times daily as needed for a few days. May also try Ibuprofen or Aleve per instructions.  Some important numbers from today's visit: BP - 132/83  Please schedule a follow-up appointment with Dr. Ermalinda MemosBradshaw anytime in 1-2 months for New Patient Evaluation and to discuss lab work.  If you have any other questions or concerns, please feel free to call the clinic to contact me. You may also schedule an earlier appointment if necessary.  However, if your symptoms get significantly worse, please go to the Emergency Department to seek immediate medical attention.  Saralyn PilarAlexander Maripat Borba, DO [facility]
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
## 2013-10-10 ENCOUNTER — Encounter: Payer: Self-pay | Admitting: Family Medicine

## 2013-10-10 ENCOUNTER — Telehealth: Payer: Self-pay | Admitting: Family Medicine

## 2013-10-10 NOTE — Telephone Encounter (Signed)
Will forward to last MD seen 

## 2013-10-10 NOTE — Telephone Encounter (Signed)
New note excuse for school 10/09/13 through 10/11/13 and return on 10/12/13 printed and signed. Returned to AT&TJacqueline Warner in front office, to notify patient that it is available for pick-up.  Thanks, Saralyn PilarAlexander Elijan Googe, DO [facility], PGY-2

## 2013-10-10 NOTE — Telephone Encounter (Signed)
Pt called and still has a headache and would like another letter for missing school for two more days. Please call when ready for pickup. jw

## 2013-10-25 ENCOUNTER — Encounter: Payer: Self-pay | Admitting: Family Medicine

## 2013-11-08 ENCOUNTER — Ambulatory Visit: Payer: Self-pay

## 2013-11-15 ENCOUNTER — Encounter: Payer: Self-pay | Admitting: Family Medicine

## 2013-11-15 ENCOUNTER — Ambulatory Visit (INDEPENDENT_AMBULATORY_CARE_PROVIDER_SITE_OTHER): Payer: Self-pay | Admitting: Family Medicine

## 2013-11-15 ENCOUNTER — Other Ambulatory Visit (HOSPITAL_COMMUNITY)
Admission: RE | Admit: 2013-11-15 | Discharge: 2013-11-15 | Disposition: A | Discharge status: Home / Self Care | Payer: Self-pay | Source: Ambulatory Visit | Attending: Family Medicine | Admitting: Family Medicine

## 2013-11-15 VITALS — BP 131/82 | HR 88 | Temp 98.9°F | Wt 183.0 lb

## 2013-11-15 DIAGNOSIS — Z113 Encounter for screening for infections with a predominantly sexual mode of transmission: Secondary | ICD-10-CM | POA: Insufficient documentation

## 2013-11-15 DIAGNOSIS — R5383 Other fatigue: Secondary | ICD-10-CM | POA: Insufficient documentation

## 2013-11-15 DIAGNOSIS — Z01419 Encounter for gynecological examination (general) (routine) without abnormal findings: Secondary | ICD-10-CM | POA: Insufficient documentation

## 2013-11-15 DIAGNOSIS — R05 Cough: Secondary | ICD-10-CM

## 2013-11-15 DIAGNOSIS — Z0000 Encounter for general adult medical examination without abnormal findings: Secondary | ICD-10-CM

## 2013-11-15 DIAGNOSIS — R5382 Chronic fatigue, unspecified: Secondary | ICD-10-CM

## 2013-11-15 DIAGNOSIS — R5381 Other malaise: Secondary | ICD-10-CM

## 2013-11-15 DIAGNOSIS — Z1151 Encounter for screening for human papillomavirus (HPV): Secondary | ICD-10-CM | POA: Insufficient documentation

## 2013-11-15 DIAGNOSIS — N898 Other specified noninflammatory disorders of vagina: Secondary | ICD-10-CM

## 2013-11-15 DIAGNOSIS — Z124 Encounter for screening for malignant neoplasm of cervix: Secondary | ICD-10-CM

## 2013-11-15 DIAGNOSIS — R059 Cough, unspecified: Secondary | ICD-10-CM | POA: Insufficient documentation

## 2013-11-15 LAB — CBC WITH DIFFERENTIAL/PLATELET
BASOS PCT: 2 % — AB (ref 0–1)
Basophils Absolute: 0.1 10*3/uL (ref 0.0–0.1)
Eosinophils Absolute: 0.2 10*3/uL (ref 0.0–0.7)
Eosinophils Relative: 4 % (ref 0–5)
HEMATOCRIT: 31.8 % — AB (ref 36.0–46.0)
HEMOGLOBIN: 10.4 g/dL — AB (ref 12.0–15.0)
LYMPHS ABS: 1.8 10*3/uL (ref 0.7–4.0)
LYMPHS PCT: 38 % (ref 12–46)
MCH: 25.6 pg — ABNORMAL LOW (ref 26.0–34.0)
MCHC: 32.7 g/dL (ref 30.0–36.0)
MCV: 78.3 fL (ref 78.0–100.0)
MONOS PCT: 9 % (ref 3–12)
Monocytes Absolute: 0.4 10*3/uL (ref 0.1–1.0)
NEUTROS ABS: 2.3 10*3/uL (ref 1.7–7.7)
NEUTROS PCT: 47 % (ref 43–77)
Platelets: 320 10*3/uL (ref 150–400)
RBC: 4.06 MIL/uL (ref 3.87–5.11)
RDW: 15.2 % (ref 11.5–15.5)
WBC: 4.8 10*3/uL (ref 4.0–10.5)

## 2013-11-15 LAB — BASIC METABOLIC PANEL
BUN: 11 mg/dL (ref 6–23)
CALCIUM: 9.1 mg/dL (ref 8.4–10.5)
CO2: 26 mEq/L (ref 19–32)
CREATININE: 0.69 mg/dL (ref 0.50–1.10)
Chloride: 104 mEq/L (ref 96–112)
Glucose, Bld: 84 mg/dL (ref 70–99)
Potassium: 3.9 mEq/L (ref 3.5–5.3)
SODIUM: 137 meq/L (ref 135–145)

## 2013-11-15 NOTE — Assessment & Plan Note (Signed)
Pap smear today, with GCC Mammo to be scheduled

## 2013-11-15 NOTE — Assessment & Plan Note (Signed)
Fatigue for several months and leg cramps Check labs CBC, BMP, TSH Followup after labs
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
## 2013-11-15 NOTE — Assessment & Plan Note (Signed)
With activity, also associated the new cat Start Zyrtec or Claritin, also discussed possibility of reactive airway disease Lungs clear today Consider albuterol trial if persistent.

## 2013-11-15 NOTE — Patient Instructions (Signed)
Great to see you!  I have doen some lab work  Call the breast center to set up a mammo  I will let you know the results of your pap smear

## 2013-11-16 LAB — TSH: TSH: 1.711 u[IU]/mL (ref 0.350–4.500)

## 2013-11-17 LAB — CYTOLOGY - PAP

## 2013-11-20 ENCOUNTER — Telehealth: Payer: Self-pay | Admitting: Family Medicine

## 2013-11-20 DIAGNOSIS — D509 Iron deficiency anemia, unspecified: Secondary | ICD-10-CM | POA: Insufficient documentation

## 2013-11-20 NOTE — Assessment & Plan Note (Signed)
Patient with crescentic anemia Most likely iron deficiency, however given constipation with iron supplements Will check ferritin to prove this. Likely start iron with colace after ferritin level.

## 2013-11-20 NOTE — Telephone Encounter (Signed)
Called to discuss labs  Has microcytic anemia, she is amenable to getting ferritin checked and starting iron with colace if its low given that she has constipation with fe.   Murtis Sink, MD [facility] Resident, PGY-3 11/20/2013, 4:04 PM

## 2013-12-01 ENCOUNTER — Other Ambulatory Visit: Payer: Self-pay

## 2013-12-01 DIAGNOSIS — D509 Iron deficiency anemia, unspecified: Secondary | ICD-10-CM

## 2013-12-01 LAB — FERRITIN: FERRITIN: 4 ng/mL — AB (ref 10–291)

## 2013-12-01 NOTE — Progress Notes (Signed)
FERRITIN DONE TODAY Cynthia Greene 

## 2013-12-04 ENCOUNTER — Telehealth: Payer: Self-pay | Admitting: Family Medicine

## 2013-12-04 DIAGNOSIS — D509 Iron deficiency anemia, unspecified: Secondary | ICD-10-CM

## 2013-12-04 MED ORDER — FERROUS SULFATE 325 (65 FE) MG PO TABS: 325.0000 mg | ORAL_TABLET | Freq: Two times a day (BID) | ORAL | Status: DC

## 2013-12-04 NOTE — Telephone Encounter (Signed)
This is clearly iron deficiency anemia. Will begin replacement PO.   Will ask staff to inform.  Murtis Sink, MD [facility] Resident, PGY-3 12/04/2013, 9:27 AM

## 2013-12-04 NOTE — Telephone Encounter (Signed)
Left message on voicemail for patient to call back regarding labs. 

## 2015-03-12 ENCOUNTER — Encounter: Payer: Self-pay | Admitting: Family Medicine

## 2015-03-12 ENCOUNTER — Ambulatory Visit (INDEPENDENT_AMBULATORY_CARE_PROVIDER_SITE_OTHER): Payer: Self-pay | Admitting: Family Medicine

## 2015-03-12 VITALS — BP 120/80 | HR 89 | Temp 99.0°F | Ht 65.0 in | Wt 182.5 lb

## 2015-03-12 DIAGNOSIS — J0100 Acute maxillary sinusitis, unspecified: Secondary | ICD-10-CM

## 2015-03-12 MED ORDER — AZITHROMYCIN 250 MG PO TABS: ORAL_TABLET | ORAL | Status: AC

## 2015-03-12 NOTE — Patient Instructions (Signed)
Thank you so much for coming to visit today! I have sent in an antibiotic for you to take. Please take two tablets today followed by one tablet daily to complete a five day course. You may continue to take over the counter medications to help with the symptoms.   Thanks again! Dr. Caroleen Hammanumley

## 2015-03-13 NOTE — Progress Notes (Signed)
Subjective:     Patient ID: Cynthia Greene, female   DOB: [birth year], 45 y.o.   MRN: [id number]  HPI Mrs. Cynthia Greene is a 45yo female presenting today for upper respiratory symptoms. - Notes symptoms of hoarseness, productive cough, rhinorrhea alternating with congestion, sinus headache, fullness in ears, sore throat - First noted symptoms on Thursday. Has been worsening since that time - Denies fever - Has been using Alka Seltzer with some mild relief. Now using Dayquil/Nyquil and cough drops.  Review of Systems Per HPI    Objective:   Physical Exam  Constitutional: She appears well-developed and well-nourished. No distress.  HENT:  Head: Normocephalic and atraumatic.  Mouth/Throat: No oropharyngeal exudate.  Tenderness over left maxillary sinus. Frontal sinuses symmetric with illumination.  Cardiovascular: Normal rate and regular rhythm.  Exam reveals no gallop and no friction rub.   No murmur heard. Pulmonary/Chest: No respiratory distress. She has no wheezes. She has no rales.  Abdominal: Soft. She exhibits no distension. There is no tenderness.  Psychiatric: She has a normal mood and affect. Her behavior is normal.      Assessment and Plan:     Sinusitis - With possible extension into bronchitis with acute cough. - Prescription for Azithromycin given - Follow up if no improvement

## 2015-03-14 DIAGNOSIS — J329 Chronic sinusitis, unspecified: Secondary | ICD-10-CM | POA: Insufficient documentation

## 2015-03-14 NOTE — Assessment & Plan Note (Addendum)
-   With possible extension into bronchitis with acute cough. - Prescription for Azithromycin given - Follow up if no improvement

## 2015-06-08 ENCOUNTER — Emergency Department (INDEPENDENT_AMBULATORY_CARE_PROVIDER_SITE_OTHER): Payer: Self-pay

## 2015-06-08 ENCOUNTER — Encounter (HOSPITAL_COMMUNITY): Payer: Self-pay | Admitting: Emergency Medicine

## 2015-06-08 ENCOUNTER — Emergency Department (INDEPENDENT_AMBULATORY_CARE_PROVIDER_SITE_OTHER)
Admission: EM | Admit: 2015-06-08 | Discharge: 2015-06-08 | Disposition: A | Discharge status: Home / Self Care | Payer: Self-pay | Source: Home / Self Care | Attending: Emergency Medicine | Admitting: Emergency Medicine

## 2015-06-08 DIAGNOSIS — R1110 Vomiting, unspecified: Secondary | ICD-10-CM

## 2015-06-08 DIAGNOSIS — R0789 Other chest pain: Secondary | ICD-10-CM

## 2015-06-08 DIAGNOSIS — R05 Cough: Secondary | ICD-10-CM

## 2015-06-08 DIAGNOSIS — R059 Cough, unspecified: Secondary | ICD-10-CM

## 2015-06-08 MED ORDER — ALBUTEROL SULFATE HFA 108 (90 BASE) MCG/ACT IN AERS: 2.0000 | INHALATION_SPRAY | RESPIRATORY_TRACT | Status: AC | PRN

## 2015-06-08 NOTE — Discharge Instructions (Signed)
Your EKG and chest x-ray are normal. I suspect everything is stemming from this cough you have had for the last several years. I don't have a good explanation for your cough. Try using the albuterol inhaler and see if that helps the coughing. Make an appointment to see your primary care doctor as soon as possible.  I think you need some additional workup with pulmonary function testing and perhaps a referral to a lung doctor.

## 2015-06-08 NOTE — ED Notes (Signed)
Asked by front office to assess pt for chest pain Pt reports right sided CP onset this am, 6/10, asossciated w/dizzienss, SOB, HA and feeling tired Pain increase w/activity and unable to lift her arm above her head Adv pt to wait back in the waiting room and to notify front staff if sx change or worsen A&O x4... No acute distress... Talking in complete sentences

## 2015-06-08 NOTE — ED Provider Notes (Signed)
CSN: [id number]     Arrival date & time 06/08/15  1805 History   First MD Initiated Contact with Patient 06/08/15 1904     Chief Complaint  Patient presents with  . Chest Pain   (Consider location/radiation/quality/duration/timing/severity/associated sxs/prior Treatment) HPI  She is a 45 year old woman here for evaluation of chest pain. She states for the last 2 years she has had a cough. It has gradually worsened. Over the last several months she will cough to the point of vomiting. She has developed more mucus and phlegm with the cough. She denies any blood in the sputum or vomit. Today, she developed pain in her right anterior chest. It radiates into her right neck and right arm. She states her arm feels weak and tired compared to the left. She also states she has been getting more short of breath. She states the last several years she has had difficulty with aerobic exercise, but in the last day or so has been getting short of breath walking from her car to her home. She also reports feeling dizzy with the pain today. The pain is worse with movement of her right arm. She is a nonsmoker. No known family history of cancer. She has not seen a doctor for any of these concerns.  History reviewed. No pertinent past medical history. History reviewed. No pertinent past surgical history. No family history on file. Social History  Substance Use Topics  . Smoking status: Never Smoker   . Smokeless tobacco: None  . Alcohol Use: None   OB History    No data available     Review of Systems As in history of present illness Allergies  Review of patient's allergies indicates no known allergies.  Home Medications   Prior to Admission medications   Medication Sig Start Date End Date Taking? Authorizing Provider  albuterol (PROVENTIL HFA;VENTOLIN HFA) 108 (90 Base) MCG/ACT inhaler Inhale 2 puffs into the lungs every 4 (four) hours as needed for shortness of breath (cough). 06/08/15   Charm RingsErin J Andres Vest, MD   ferrous sulfate 325 (65 FE) MG tablet Take 1 tablet (325 mg total) by mouth 2 (two) times daily with a meal. Use stool softener daily as well 12/04/13   Elenora GammaSamuel L Bradshaw, MD  Multiple Vitamin (MULTIVITAMIN) tablet Take 1 chewable Multivitamin daily     Historical Provider, MD  Prenatal Vit-Fe Fumarate-FA (PRENATAL PLUS/IRON) 27-1 MG TABS Take 1 tablet by mouth 2 (two) times daily.      Historical Provider, MD   Meds Ordered and Administered this Visit  Medications - No data to display  BP 136/87 mmHg  Pulse 86  Temp(Src) 99.6 F (37.6 C) (Oral)  Resp 18  SpO2 100%  LMP 05/31/2015 No data found.   Physical Exam  Constitutional: She is oriented to person, place, and time. She appears well-developed and well-nourished. No distress.  Neck: Neck supple.  Cardiovascular: Normal rate, regular rhythm and normal heart sounds.   No murmur heard. Pulmonary/Chest: Effort normal and breath sounds normal. No respiratory distress. She has no wheezes. She has no rales. She exhibits tenderness (throughout right anterior chest wall).  Musculoskeletal:  Bilateral 5 strength in bilateral upper extremities.  Lymphadenopathy:    She has no cervical adenopathy.    She has no axillary adenopathy.  Neurological: She is alert and oriented to person, place, and time.    ED Course  Procedures (including critical care time) ED ECG REPORT   Date: 06/08/2015  Rate: 84  Rhythm: normal  sinus rhythm  QRS Axis: normal  Intervals: normal  ST/T Wave abnormalities: normal  Conduction Disutrbances:none  Narrative Interpretation: NSR, normal ekg  Old EKG Reviewed: none available  I have personally reviewed the EKG tracing and agree with the computerized printout as noted.  Labs Review Labs Reviewed - No data to display  Imaging Review Dg Chest 2 View  06/08/2015  CLINICAL DATA:  Chest pain beginning today.  Cough. EXAM: CHEST  2 VIEW COMPARISON:  None. FINDINGS: Lungs are adequately inflated and  otherwise clear. The cardiomediastinal silhouette is within normal. Bones and soft tissues are normal. IMPRESSION: No active cardiopulmonary disease. Electronically Signed   By: Elberta Fortis M.D.   On: 06/08/2015 19:56     MDM   1. Cough   2. Post-tussive emesis   3. Right-sided chest wall pain    Nothing concerning on EKG or CXR. I suspect her symptoms are all coming from the chronic cough.  Will do trial of albuterol. Recommended f/u with PCP for additional evaluation and possible referral.    Charm Rings, MD 06/08/15 2145

## 2015-06-12 ENCOUNTER — Encounter: Payer: Self-pay | Admitting: Family Medicine

## 2015-06-12 ENCOUNTER — Ambulatory Visit (INDEPENDENT_AMBULATORY_CARE_PROVIDER_SITE_OTHER): Payer: Self-pay | Admitting: Family Medicine

## 2015-06-12 VITALS — BP 132/83 | HR 74 | Temp 98.6°F | Ht 65.0 in | Wt 182.0 lb

## 2015-06-12 DIAGNOSIS — R053 Chronic cough: Secondary | ICD-10-CM

## 2015-06-12 DIAGNOSIS — R059 Cough, unspecified: Secondary | ICD-10-CM

## 2015-06-12 DIAGNOSIS — R05 Cough: Secondary | ICD-10-CM

## 2015-06-12 DIAGNOSIS — D509 Iron deficiency anemia, unspecified: Secondary | ICD-10-CM

## 2015-06-12 LAB — CBC WITH DIFFERENTIAL/PLATELET
BASOS ABS: 80 {cells}/uL (ref 0–200)
Basophils Relative: 2 %
EOS ABS: 200 {cells}/uL (ref 15–500)
EOS PCT: 5 %
HEMATOCRIT: 33.6 % — AB (ref 35.0–45.0)
HEMOGLOBIN: 10.5 g/dL — AB (ref 11.7–15.5)
LYMPHS ABS: 1680 {cells}/uL (ref 850–3900)
Lymphocytes Relative: 42 %
MCH: 24.6 pg — AB (ref 27.0–33.0)
MCHC: 31.3 g/dL — AB (ref 32.0–36.0)
MCV: 78.7 fL — AB (ref 80.0–100.0)
MPV: 9.8 fL (ref 7.5–12.5)
Monocytes Absolute: 360 cells/uL (ref 200–950)
Monocytes Relative: 9 %
NEUTROS PCT: 42 %
Neutro Abs: 1680 cells/uL (ref 1500–7800)
Platelets: 349 10*3/uL (ref 140–400)
RBC: 4.27 MIL/uL (ref 3.80–5.10)
RDW: 15.5 % — ABNORMAL HIGH (ref 11.0–15.0)
WBC: 4 10*3/uL (ref 3.8–10.8)

## 2015-06-12 MED ORDER — CETIRIZINE HCL 10 MG PO TABS: 10.0000 mg | ORAL_TABLET | Freq: Every day | ORAL | Status: DC

## 2015-06-12 NOTE — Patient Instructions (Addendum)
Schedule appointment with Dr. Raymondo BandKoval for pulmonary function testing Start zyrtec 10mg  daily  Checking labwork today Follow up with Dr. Wende MottMcKeag in 2-3 weeks  See handout below on acid reflux  Be well, Dr. Pollie MeyerMcIntyre   Gastroesophageal Reflux Disease, Adult Normally, food travels down the esophagus and stays in the stomach to be digested. However, when a person has gastroesophageal reflux disease (GERD), food and stomach acid move back up into the esophagus. When this happens, the esophagus becomes sore and inflamed. Over time, GERD can create small holes (ulcers) in the lining of the esophagus.  CAUSES This condition is caused by a problem with the muscle between the esophagus and the stomach (lower esophageal sphincter, or LES). Normally, the LES muscle closes after food passes through the esophagus to the stomach. When the LES is weakened or abnormal, it does not close properly, and that allows food and stomach acid to go back up into the esophagus. The LES can be weakened by certain dietary substances, medicines, and medical conditions, including:  Tobacco use.  Pregnancy.  Having a hiatal hernia.  Heavy alcohol use.  Certain foods and beverages, such as coffee, chocolate, onions, and peppermint. RISK FACTORS This condition is more likely to develop in:  People who have an increased body weight.  People who have connective tissue disorders.  People who use NSAID medicines. SYMPTOMS Symptoms of this condition include:  Heartburn.  Difficult or painful swallowing.  The feeling of having a lump in the throat.  Abitter taste in the mouth.  Bad breath.  Having a large amount of saliva.  Having an upset or bloated stomach.  Belching.  Chest pain.  Shortness of breath or wheezing.  Ongoing (chronic) cough or a night-time cough.  Wearing away of tooth enamel.  Weight loss. Different conditions can cause chest pain. Make sure to see your health care provider if you  experience chest pain. DIAGNOSIS Your health care provider will take a medical history and perform a physical exam. To determine if you have mild or severe GERD, your health care provider may also monitor how you respond to treatment. You may also have other tests, including:  An endoscopy toexamine your stomach and esophagus with a small camera.  A test thatmeasures the acidity level in your esophagus.  A test thatmeasures how much pressure is on your esophagus.  A barium swallow or modified barium swallow to show the shape, size, and functioning of your esophagus. TREATMENT The goal of treatment is to help relieve your symptoms and to prevent complications. Treatment for this condition may vary depending on how severe your symptoms are. Your health care provider may recommend:  Changes to your diet.  Medicine.  Surgery. HOME CARE INSTRUCTIONS Diet  Follow a diet as recommended by your health care provider. This may involve avoiding foods and drinks such as:  Coffee and tea (with or without caffeine).  Drinks that containalcohol.  Energy drinks and sports drinks.  Carbonated drinks or sodas.  Chocolate and cocoa.  Peppermint and mint flavorings.  Garlic and onions.  Horseradish.  Spicy and acidic foods, including peppers, chili powder, curry powder, vinegar, hot sauces, and barbecue sauce.  Citrus fruit juices and citrus fruits, such as oranges, lemons, and limes.  Tomato-based foods, such as red sauce, chili, salsa, and pizza with red sauce.  Fried and fatty foods, such as donuts, french fries, potato chips, and high-fat dressings.  High-fat meats, such as hot dogs and fatty cuts of red and white meats,  such as rib eye steak, sausage, ham, and bacon.  High-fat dairy items, such as whole milk, butter, and cream cheese.  Eat small, frequent meals instead of large meals.  Avoid drinking large amounts of liquid with your meals.  Avoid eating meals during the  2-3 hours before bedtime.  Avoid lying down right after you eat.  Do not exercise right after you eat. General Instructions  Pay attention to any changes in your symptoms.  Take over-the-counter and prescription medicines only as told by your health care provider. Do not take aspirin, ibuprofen, or other NSAIDs unless your health care provider told you to do so.  Do not use any tobacco products, including cigarettes, chewing tobacco, and e-cigarettes. If you need help quitting, ask your health care provider.  Wear loose-fitting clothing. Do not wear anything tight around your waist that causes pressure on your abdomen.  Raise (elevate) the head of your bed 6 inches (15cm).  Try to reduce your stress, such as with yoga or meditation. If you need help reducing stress, ask your health care provider.  If you are overweight, reduce your weight to an amount that is healthy for you. Ask your health care provider for guidance about a safe weight loss goal.  Keep all follow-up visits as told by your health care provider. This is important. SEEK MEDICAL CARE IF:  You have new symptoms.  You have unexplained weight loss.  You have difficulty swallowing, or it hurts to swallow.  You have wheezing or a persistent cough.  Your symptoms do not improve with treatment.  You have a hoarse voice. SEEK IMMEDIATE MEDICAL CARE IF:  You have pain in your arms, neck, jaw, teeth, or back.  You feel sweaty, dizzy, or light-headed.  You have chest pain or shortness of breath.  You vomit and your vomit looks like blood or coffee grounds.  You faint.  Your stool is bloody or black.  You cannot swallow, drink, or eat.   This information is not intended to replace advice given to you by your health care provider. Make sure you discuss any questions you have with your health care provider.   Document Released: 12/03/2004 Document Revised: 11/14/2014 Document Reviewed: 06/20/2014 Elsevier  Interactive Patient Education [employer].

## 2015-06-12 NOTE — Progress Notes (Signed)
Date of Visit: 06/12/2015   HPI:  Patient presents to discuss cough.  Reports having cough for at least 2 years. It is mostly dry but occasionally produces phlegm/mucous. No blood. No fever, night sweats, weight loss. Gets shortness of breath when coughing fits. Has spells of coughing that last a few minutes at a time and occur 3-4 times per day. Occasionally vomits after coughing spells. Has noticed triggers of anything with a strong smell. Does not get shortness of breath when she coughs. Has never smoked. No swelling in her legs. 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   Seen at urgent care on 4/1 for chest pain in R upper chest/arm. EKG and CXR were unremarkable at that visit. Chest pain thought to be musculoskeletal in etiology. Given trial of empiric albuterol, which patient states has not helped the cough. She's tried in a few times. Chest pain has improved. Reports occasional "pulling in chest" when she ambulates, ongoing.   Family history of maternal grandmother with COPD. Hypertension & diabetes runs in family. Dad has history of liver cancer.  Overall she does not like coming to the doctor and does not like taking medications. She reports a history of iron deficiency anemia. Also reports chronic polyuria, especially at night, urinates about every 2 hours but states she drinks lots of water due to coughing so much.  ROS: See HPI.  PMFSH: history of sciatica, exostosis, iron deficiency anemia. Breeze.[id number]  PHYSICAL EXAM: BP 132/83 mmHg  Pulse 74  Temp(Src) 98.6 F (37 C) (Oral)  Ht 5\' 5"  (1.651 m)  Wt 182 lb (82.555 kg)  BMI 30.29 kg/m2  LMP 05/31/2015 Gen: NAD, pleasant, cooperative HEENT: normocephalic, atraumatic. Moist mucous membranes, oropharynx clear and moist, No anterior cervical or supraclavicular lymphadenopathy. No thyromegaly. Tympanic membranes initially not visualized secondary to cerumen impaction, but after irrigation appear normal Heart: regular rate and rhythm,  no murmur Lungs: clear to auscultation bilaterally, normal work of breathing, no crackles or wheezes. Has occasional bad coughing fits.  Neuro: alert, grossly nonfocal, speech normal Ext: No appreciable lower extremity edema bilaterally   ASSESSMENT/PLAN:  Iron deficiency anemia Reported by patient, noted in chart. Check CBC & iron studies with labs today. Follow up with PCP for further evaluation.  Cough Chronic cough of 2 years duration. Differential diagnosis includes intrinsic lung disease (pulmonary fibrosis, COPD/asthma, sarcoid), GERD, allergies/postnasal drip, among other things. Examination unremarkable today. The main item of interest in her history is that she works in a [employer] salon and is thus exposed to many types of chemicals. Recent CXR & EKG unremarkable as well. Discussed options for further testing/treatment today including empiric trial of PPI, allergy medication, PFT's, pulmonology referral. Patient and her mother are very averse to empiric PPI, as her mom "knows someone who was put on a PPI due to chest pain and died 3 days later". Discussed mechanism of GERD causing chronic cough. After discussion, plan we chose is: - start zyrtec 10mg  daily - check labs today: CBC, CMET, iron studies (due to history of iron deficiency anemia) - check spirometry/PFTs at appointment with Dr. Raymondo BandKoval - follow up with PCP in 2 weeks to evaluate for improvement. - gave handout on GERD for patient to review   FOLLOW UP: Follow up in 2-3 weeks with PCP for cough and anemia  GrenadaBrittany J. Pollie MeyerMcIntyre, MD [facility]

## 2015-06-13 ENCOUNTER — Encounter: Payer: Self-pay | Admitting: Family Medicine

## 2015-06-13 LAB — FERRITIN: FERRITIN: 5 ng/mL — AB (ref 10–232)

## 2015-06-13 LAB — COMPLETE METABOLIC PANEL WITH GFR
ALBUMIN: 3.6 g/dL (ref 3.6–5.1)
ALK PHOS: 49 U/L (ref 33–115)
ALT: 5 U/L — ABNORMAL LOW (ref 6–29)
AST: 16 U/L (ref 10–30)
BUN: 8 mg/dL (ref 7–25)
CO2: 26 mmol/L (ref 20–31)
Calcium: 9 mg/dL (ref 8.6–10.2)
Chloride: 106 mmol/L (ref 98–110)
Creat: 0.7 mg/dL (ref 0.50–1.10)
GFR, Est African American: >89 mL/min (ref >=60)
GFR, Est Non African American: >89 mL/min (ref >=60)
GLUCOSE: 93 mg/dL (ref 65–99)
POTASSIUM: 4.1 mmol/L (ref 3.5–5.3)
Sodium: 139 mmol/L (ref 135–146)
TOTAL PROTEIN: 6.6 g/dL (ref 6.1–8.1)
Total Bilirubin: 0.3 mg/dL (ref 0.2–1.2)

## 2015-06-13 LAB — IRON AND TIBC
%SAT: 6 % — AB (ref 11–50)
IRON: 27 ug/dL — AB (ref 40–190)
TIBC: 484 ug/dL — ABNORMAL HIGH (ref 250–450)
UIBC: 457 ug/dL — ABNORMAL HIGH (ref 125–400)

## 2015-06-13 NOTE — Assessment & Plan Note (Signed)
Reported by patient, noted in chart. Check CBC & iron studies with labs today. Follow up with PCP for further evaluation.

## 2015-06-13 NOTE — Assessment & Plan Note (Addendum)
Chronic cough of 2 years duration. Differential diagnosis includes intrinsic lung disease (pulmonary fibrosis, COPD/asthma, sarcoid), GERD, allergies/postnasal drip, among other things. Examination unremarkable today. The main item of interest in her history is that she works in a [employer] and is thus exposed to many types of chemicals. Recent CXR & EKG unremarkable as well. Discussed options for further testing/treatment today including empiric trial of PPI, allergy medication, PFT's, pulmonology referral. Patient and her mother are very averse to empiric PPI, as her mom "knows someone who was put on a PPI due to chest pain and died 3 days later". Discussed mechanism of GERD causing chronic cough. After discussion, plan we chose is: - start zyrtec 10mg  daily - check labs today: CBC, CMET, iron studies (due to history of iron deficiency anemia) - check spirometry/PFTs at appointment with Dr. Raymondo BandKoval - follow up with PCP in 2 weeks to evaluate for improvement. - gave handout on GERD for patient to review

## 2015-06-14 ENCOUNTER — Encounter: Payer: Self-pay | Admitting: Pharmacist

## 2015-06-14 ENCOUNTER — Ambulatory Visit (INDEPENDENT_AMBULATORY_CARE_PROVIDER_SITE_OTHER): Payer: Self-pay | Admitting: Pharmacist

## 2015-06-14 VITALS — Ht 65.0 in | Wt 183.3 lb

## 2015-06-14 DIAGNOSIS — R059 Cough, unspecified: Secondary | ICD-10-CM

## 2015-06-14 DIAGNOSIS — R05 Cough: Secondary | ICD-10-CM

## 2015-06-14 NOTE — Progress Notes (Signed)
S:    Patient arrives in good spirits.  Presents for lung function evaluation due to a persistent cough.  Patient was referred on 06/12/15 by Dr. Pollie MeyerMcIntyre.  Patient was last seen by Primary Care Provider on 06/12/15.  Patient reports she is not having any problems with breathing, but she still has a persistent cough that sometimes causes her to throw up.  Patient reports she has been taking her cetirizine that was recently prescribed.  O: See "scanned report" or Documentation Flowsheet (discrete results - PFTs) for Spirometry results. Patient provided good effort while attempting spirometry.   A/P: Spirometry evaluation reveals NORMAL lung function. Pt was also prescribed an albuterol inhaler but has not been using it. Denies significant dyspnea.  Patient has been experiencing a persistent cough for several years, but it has gotten worse lately. She is currently taking cetirizine daily and notes that it is making her sleepy. The symptoms she is describing sound like a post-nasal drip cough due to some type of allergy. We recommended that she begin taking her cetirizine at bedtime and that she begin using saline nasal spray frequently during the day to wash out irritants, especially while she is at work. She reports that she also wears a mask while at work to reduce exposure to chemical irritants. If after 1 week the nasal saline isn't helping her, we recommended that she try fluticasone or triamcinolone nasal spray, with instructions to spray away from the center of her nose and plug one nostril while spraying and sniff gently. We also suggested she could  consider a trial of fexofenadine or loratadine in place of cetirizine to see if these work better for her. We also discussed the possibility of needing to see an allergist for more comprehensive testing of her allergies. We identified possibly cats as a trigger in her house.  Educated patient on purpose, proper use, potential adverse effects including  drowsiness, nasal irritation, and bad taste. Reviewed results of pulmonary function tests.  Pt verbalized understanding of results and education.  Written pt instructions provided.  F/U Clinic visit in a few months or sooner if needed. Total time in face to face counseling 20 minutes.  Patient seen with Arcola JanskyMeagan Decker, PharmD Resident.

## 2015-06-14 NOTE — Assessment & Plan Note (Signed)
Spirometry evaluation reveals NORMAL lung function. Pt was also prescribed an albuterol inhaler but has not been using it. Denies significant dyspnea.  Patient has been experiencing a persistent cough for several years, but it has gotten worse lately. She is currently taking cetirizine daily and notes that it is making her sleepy. The symptoms she is describing sound like a post-nasal drip cough due to some type of allergy. We recommended that she begin taking her cetirizine at bedtime and that she begin using saline nasal spray frequently during the day to wash out irritants, especially while she is at work. She reports that she also wears a mask while at work to reduce exposure to chemical irritants. If after 1 week the nasal saline isn't helping her, we recommended that she try fluticasone or triamcinolone nasal spray, with instructions to spray away from the center of her nose and plug one nostril while spraying and sniff gently. We also suggested she could  consider a trial of fexofenadine or loratadine in place of cetirizine to see if these work better for her. We also discussed the possibility of needing to see an allergist for more comprehensive testing of her allergies. We identified possibly cats as a trigger in her house.

## 2015-06-14 NOTE — Patient Instructions (Signed)
Thank you for coming in today!  Your lung function tests were all NORMAL.  Use nasal saline spray (like Ocean) frequently during the day to wash out irritants, especially while at work.   If after 1 week the nasal saline isn't helping, you can try Flonase or Nasocort Nasal Spray 2 sprays in each nostril twice daily. Spray aware from the center of your nose (towards your ears). Plug one nostril while spraying and sniff gently.   Also consider a trial of fexofenadine (Allegra) or loratadine (Claritin). Do not take at the same time as the cetirizine (Zyrtec) you are currently taking. Take them at daily at bedtime to prevent sleepiness during the day.

## 2015-06-14 NOTE — Progress Notes (Signed)
Patient ID: Cynthia Greene, female   DOB: 06-28-1970, 45 y.o.   MRN: [id number] Reviewed: Agree with Dr. Macky LowerKoval's documentation and management.

## 2015-07-05 ENCOUNTER — Other Ambulatory Visit: Payer: Self-pay | Admitting: Family Medicine

## 2015-07-05 ENCOUNTER — Telehealth: Payer: Self-pay | Admitting: Family Medicine

## 2015-07-05 MED ORDER — LORATADINE 10 MG PO TABS: 10.0000 mg | ORAL_TABLET | Freq: Every day | ORAL | Status: DC

## 2015-07-05 NOTE — Telephone Encounter (Signed)
Her allergy medicine is making her groggy.  She is taking cetirizine . She would like to have something else called in that is non drowsy [facility] [address]

## 2015-07-05 NOTE — Telephone Encounter (Signed)
Prescription for Claritin placed.

## 2015-07-09 NOTE — Telephone Encounter (Signed)
LMOVM for pt to call us back. Shiza Thelen, CMA  

## 2015-07-10 NOTE — Telephone Encounter (Signed)
Pt informed. Rilley Poulter, CMA  

## 2015-09-30 ENCOUNTER — Other Ambulatory Visit: Payer: Self-pay | Admitting: Family Medicine

## 2015-10-29 ENCOUNTER — Ambulatory Visit (INDEPENDENT_AMBULATORY_CARE_PROVIDER_SITE_OTHER): Payer: Self-pay | Admitting: *Deleted

## 2015-10-29 DIAGNOSIS — Z111 Encounter for screening for respiratory tuberculosis: Secondary | ICD-10-CM

## 2015-10-29 NOTE — Progress Notes (Signed)
   PPD placed Left Forearm.  Pt to return 10/31/2015, Thursday for reading.  Pt tolerated intradermal injection. Clovis PuMartin, Kewanda Poland L, RN

## 2015-11-01 ENCOUNTER — Ambulatory Visit (INDEPENDENT_AMBULATORY_CARE_PROVIDER_SITE_OTHER): Payer: Self-pay | Admitting: *Deleted

## 2015-11-01 ENCOUNTER — Encounter: Payer: Self-pay | Admitting: *Deleted

## 2015-11-01 DIAGNOSIS — Z7689 Persons encountering health services in other specified circumstances: Secondary | ICD-10-CM

## 2015-11-01 DIAGNOSIS — Z111 Encounter for screening for respiratory tuberculosis: Secondary | ICD-10-CM

## 2015-11-01 LAB — TB SKIN TEST
Induration: 0 mm
TB Skin Test: NEGATIVE

## 2015-11-01 NOTE — Progress Notes (Signed)
   PPD Reading Note PPD read and results entered in EpicCare. Result: 0 mm induration. Interpretation: Negative If test not read within 48-72 hours of initial placement, patient advised to repeat in other arm 1-3 weeks after this test. Allergic reaction: no  Martin, Tamika L, RN  

## 2015-12-11 ENCOUNTER — Other Ambulatory Visit: Payer: Self-pay | Admitting: Family Medicine

## 2016-04-24 ENCOUNTER — Ambulatory Visit: Payer: Self-pay | Admitting: Family Medicine

## 2016-04-24 ENCOUNTER — Ambulatory Visit (INDEPENDENT_AMBULATORY_CARE_PROVIDER_SITE_OTHER): Payer: BLUE CROSS/BLUE SHIELD | Admitting: Family Medicine

## 2016-04-24 ENCOUNTER — Ambulatory Visit (HOSPITAL_COMMUNITY)
Admission: RE | Admit: 2016-04-24 | Discharge: 2016-04-24 | Disposition: A | Discharge status: Home / Self Care | Payer: BLUE CROSS/BLUE SHIELD | Source: Ambulatory Visit | Attending: Family Medicine | Admitting: Family Medicine

## 2016-04-24 VITALS — BP 122/90 | HR 85 | Temp 98.7°F | Ht 65.0 in | Wt 178.6 lb

## 2016-04-24 DIAGNOSIS — R112 Nausea with vomiting, unspecified: Secondary | ICD-10-CM | POA: Diagnosis not present

## 2016-04-24 DIAGNOSIS — D508 Other iron deficiency anemias: Secondary | ICD-10-CM | POA: Diagnosis not present

## 2016-04-24 DIAGNOSIS — R079 Chest pain, unspecified: Secondary | ICD-10-CM | POA: Insufficient documentation

## 2016-04-24 NOTE — Progress Notes (Signed)
   HPI  CC: Chest pain and vomiting. VOMITING Been occurring randomly. Fairly sudden onset. A lot of heaves. Notes to have fairly regular constipation. Only has BMs every 3-4 days. Typically very firm.  Vomiting occurs once every 2 weeks or so. Been going on for a year. Progression: none. Minimal symptoms leading up to these episodes. Number of times vomited in last day: 0 Medications tried: no Recent travel: no Recent sick contacts: no Ingested suspicious foods: no Immunocompromised: no  Symptoms Diarrhea: no Abdominal pain: no Blood in vomit: no Weight loss: no Decreased urine output:no Lightheadedness: no Fever: no Bloody stools: no   Chest Pain: Patient is also complaining of chest pain. Chest pain is not present today. It is intermittent and short-lived. Described as cramping in nature. Not pressure-like area no associated shortness of breath, nausea, vomiting, diaphoresis, dizziness, or lightheadedness. Possibly associated with anxiety. Patient is a nonsmoker. Very health conscientious. Patient denies any recreational drug use or alcohol use. No family history of heart disease. Heart score of 0 (assuming troponin negative).  Review of Systems    See HPI for ROS. All other systems reviewed and are negative.  CC, SH/smoking status, and VS noted  Objective: BP 122/90 (BP Location: Right Arm, Patient Position: Sitting, Cuff Size: Normal)   Pulse 85   Temp 98.7 F (37.1 C) (Oral)   Ht 5\' 5"  (1.651 m)   Wt 178 lb 9.6 oz (81 kg)   LMP 04/13/2016 (Exact Date)   SpO2 99%   BMI 29.72 kg/m  Gen: NAD, alert, cooperative, and pleasant. HEENT: NCAT, EOMI, PERRL CV: RRR, no murmur Resp: CTAB, no wheezes, non-labored Abd: SNTND, BS present, no guarding or organomegaly, no CVA tenderness Ext: No edema, warm Neuro: Alert and oriented, Speech clear, No gross deficits  EKG:  - Normal sinus rhythm. No evidence of ischemia.  Assessment and plan:  Chest pain Patient is  complaining of intermittent chest pain. Not actively present today. EKG was reassuring without any evidence of ischemia. Heart score, assuming troponin negative, was 0. Etiology unknown however deemed most likely to be MSK or GERD related. Another possibility could be anxiety related, as patient mentioned multiple times that her stress levels have been higher recently than in the past. Patient is not interested in initiating PPI today. - EKG today unremarkable - Offered PPI. Patient was not interested. - Strict return precautions discussed. - Patient has an appointment with me in one month, will obtain labs at that time.  Vomiting Patient is also complaining of some vomiting. Events seem to be separated by 1-2 weeks at least. When these occur episodes appear to be relatively fast onset and short-term. Only 1-2 episodes occur each event. Emesis is nonbloody/nonbilious. Etiology unknown at this time however, deemed most likely secondary to constipation as patient has had issues with this in the past and typically has 1 BM every 3-4 days. - Recommended stool softeners/fiber supplements. (MiraLAX, Metamucil, prune juice). Titrate until having 1-2 loose but not watery stools daily. - return precautions discussed.  Iron deficiency anemia Discussed initiating women's multivitamin containing iron. Patient stated that she would "think about it". - could be contributing to "chest pain" but deemed highly unlikely.   Orders Placed This Encounter  Procedures  . EKG 12-Lead    Kathee DeltonIan D Johndavid Geralds, MD,MS,  PGY3 04/25/2016 8:05 PM

## 2016-04-24 NOTE — Patient Instructions (Addendum)
It was a pleasure seeing you today in our clinic. Today we discussed your nausea/vomiting and chest pain. Here is the treatment plan we have discussed and agreed upon together:   - I believe your vomiting episodes may be related to some of the constipation you are experiencing. Because of this I would try to begin taking some MiraLAX or prune juice every day. Over-the-counter fiber supplements (Metamucil) may also be of use. When using these a target of 1-2 bowel movements a day is ideal. You can increase the frequency of use of these substances until this target is reached. - Regarding her chest pain. Your EKG was very reassuring that there is nothing more significant going on. I feel strongly that the cause of this chest pain is either muscle related (cramp), or reflux/heartburn related. I would like to start you on a medication to help with reflux/heartburn (as we discussed in clinic today). If you change your mind and would like to start this medication please schedule a follow-up appointment. - I would like for you to start taking a multivitamin. I would prefer a "women's multivitamin" or "prenatal vitamin" as these formulations typically have more iron incorporated. Please take one of these once a day every morning.

## 2016-04-25 DIAGNOSIS — R1110 Vomiting, unspecified: Secondary | ICD-10-CM | POA: Insufficient documentation

## 2016-04-25 DIAGNOSIS — R079 Chest pain, unspecified: Secondary | ICD-10-CM | POA: Insufficient documentation

## 2016-04-25 NOTE — Assessment & Plan Note (Signed)
Patient is also complaining of some vomiting. Events seem to be separated by 1-2 weeks at least. When these occur episodes appear to be relatively fast onset and short-term. Only 1-2 episodes occur each event. Emesis is nonbloody/nonbilious. Etiology unknown at this time however, deemed most likely secondary to constipation as patient has had issues with this in the past and typically has 1 BM every 3-4 days. - Recommended stool softeners/fiber supplements. (MiraLAX, Metamucil, prune juice). Titrate until having 1-2 loose but not watery stools daily. - return precautions discussed.

## 2016-04-25 NOTE — Assessment & Plan Note (Addendum)
Discussed initiating women's multivitamin containing iron. Patient stated that she would "think about it". - could be contributing to "chest pain" but deemed highly unlikely.

## 2016-04-25 NOTE — Assessment & Plan Note (Signed)
Patient is complaining of intermittent chest pain. Not actively present today. EKG was reassuring without any evidence of ischemia. Heart score, assuming troponin negative, was 0. Etiology unknown however deemed most likely to be MSK or GERD related. Another possibility could be anxiety related, as patient mentioned multiple times that her stress levels have been higher recently than in the past. Patient is not interested in initiating PPI today. - EKG today unremarkable - Offered PPI. Patient was not interested. - Strict return precautions discussed. - Patient has an appointment with me in one month, will obtain labs at that time.

## 2016-05-18 ENCOUNTER — Other Ambulatory Visit (HOSPITAL_COMMUNITY)
Admission: RE | Admit: 2016-05-18 | Discharge: 2016-05-18 | Disposition: A | Discharge status: Home / Self Care | Payer: BLUE CROSS/BLUE SHIELD | Source: Ambulatory Visit | Attending: Family Medicine | Admitting: Family Medicine

## 2016-05-18 ENCOUNTER — Encounter: Payer: Self-pay | Admitting: Family Medicine

## 2016-05-18 ENCOUNTER — Ambulatory Visit (INDEPENDENT_AMBULATORY_CARE_PROVIDER_SITE_OTHER): Payer: BLUE CROSS/BLUE SHIELD | Admitting: Family Medicine

## 2016-05-18 VITALS — BP 134/76 | HR 70 | Temp 98.4°F | Ht 65.0 in | Wt 183.2 lb

## 2016-05-18 DIAGNOSIS — Z113 Encounter for screening for infections with a predominantly sexual mode of transmission: Secondary | ICD-10-CM | POA: Insufficient documentation

## 2016-05-18 DIAGNOSIS — Z0000 Encounter for general adult medical examination without abnormal findings: Secondary | ICD-10-CM | POA: Diagnosis not present

## 2016-05-18 DIAGNOSIS — D508 Other iron deficiency anemias: Secondary | ICD-10-CM

## 2016-05-18 DIAGNOSIS — Z1151 Encounter for screening for human papillomavirus (HPV): Secondary | ICD-10-CM | POA: Insufficient documentation

## 2016-05-18 DIAGNOSIS — Z01411 Encounter for gynecological examination (general) (routine) with abnormal findings: Secondary | ICD-10-CM | POA: Insufficient documentation

## 2016-05-18 DIAGNOSIS — L309 Dermatitis, unspecified: Secondary | ICD-10-CM

## 2016-05-18 LAB — BASIC METABOLIC PANEL WITH GFR
BUN: 11 mg/dL (ref 7–25)
CHLORIDE: 105 mmol/L (ref 98–110)
CO2: 28 mmol/L (ref 20–31)
Calcium: 8.7 mg/dL (ref 8.6–10.2)
Creat: 0.71 mg/dL (ref 0.50–1.10)
GFR, Est African American: >89 mL/min (ref >=60)
Glucose, Bld: 91 mg/dL (ref 65–99)
POTASSIUM: 4 mmol/L (ref 3.5–5.3)
Sodium: 138 mmol/L (ref 135–146)

## 2016-05-18 LAB — LIPID PANEL
CHOL/HDL RATIO: 4.6 ratio (ref <5.0)
Cholesterol: 214 mg/dL — ABNORMAL HIGH (ref <200)
HDL: 47 mg/dL — AB (ref >50)
LDL CALC: 152 mg/dL — AB (ref <100)
TRIGLYCERIDES: 74 mg/dL (ref <150)
VLDL: 15 mg/dL (ref <30)

## 2016-05-18 LAB — CBC
HEMATOCRIT: 33 % — AB (ref 35.0–45.0)
HEMOGLOBIN: 10.4 g/dL — AB (ref 11.7–15.5)
MCH: 25.1 pg — ABNORMAL LOW (ref 27.0–33.0)
MCHC: 31.5 g/dL — AB (ref 32.0–36.0)
MCV: 79.7 fL — ABNORMAL LOW (ref 80.0–100.0)
MPV: 10.5 fL (ref 7.5–12.5)
Platelets: 328 10*3/uL (ref 140–400)
RBC: 4.14 MIL/uL (ref 3.80–5.10)
RDW: 17.2 % — ABNORMAL HIGH (ref 11.0–15.0)
WBC: 4.3 10*3/uL (ref 3.8–10.8)

## 2016-05-18 MED ORDER — TRIAMCINOLONE ACETONIDE 0.1 % EX OINT: 1.0000 "application " | TOPICAL_OINTMENT | Freq: Two times a day (BID) | CUTANEOUS | 1 refills | Status: DC

## 2016-05-18 NOTE — Assessment & Plan Note (Signed)
Patient is here for her annual physical. She endorses some anxiety but has no desire to start any medications at this time. She also endorses some mild uterine cramping. No recent discharge. No fevers. No vaginal bleeding. However, on pelvic exam there was notable cervical discharge present. I have asked that specimen be assessed for GC/chlamydia as well as typical epithelial path. - Obtain labs - Pap smear - GC/Chlamydia

## 2016-05-18 NOTE — Patient Instructions (Signed)
Health Maintenance, Female Adopting a healthy lifestyle and getting preventive care can go a long way to promote health and wellness. Talk with your health care provider about what schedule of regular examinations is right for you. This is a good chance for you to check in with your provider about disease prevention and staying healthy. In between checkups, there are plenty of things you can do on your own. Experts have done a lot of research about which lifestyle changes and preventive measures are most likely to keep you healthy. Ask your health care provider for more information. Weight and diet Eat a healthy diet  Be sure to include plenty of vegetables, fruits, low-fat dairy products, and lean protein.  Do not eat a lot of foods high in solid fats, added sugars, or salt.  Get regular exercise. This is one of the most important things you can do for your health.  Most adults should exercise for at least 150 minutes each week. The exercise should increase your heart rate and make you sweat (moderate-intensity exercise).  Most adults should also do strengthening exercises at least twice a week. This is in addition to the moderate-intensity exercise. Maintain a healthy weight  Body mass index (BMI) is a measurement that can be used to identify possible weight problems. It estimates body fat based on height and weight. Your health care provider can help determine your BMI and help you achieve or maintain a healthy weight.  For females 76 years of age and older:  A BMI below 18.5 is considered underweight.  A BMI of 18.5 to 24.9 is normal.  A BMI of 25 to 29.9 is considered overweight.  A BMI of 30 and above is considered obese. Watch levels of cholesterol and blood lipids  You should start having your blood tested for lipids and cholesterol at 45 years of age, then have this test every 5 years.  You may need to have your cholesterol levels checked more often if:  Your lipid or  cholesterol levels are high.  You are older than 45 years of age.  You are at high risk for heart disease. Cancer screening Lung Cancer  Lung cancer screening is recommended for adults 64-42 years old who are at high risk for lung cancer because of a history of smoking.  A yearly low-dose CT scan of the lungs is recommended for people who:  Currently smoke.  Have quit within the past 15 years.  Have at least a 30-pack-year history of smoking. A pack year is smoking an average of one pack of cigarettes a day for 1 year.  Yearly screening should continue until it has been 15 years since you quit.  Yearly screening should stop if you develop a health problem that would prevent you from having lung cancer treatment. Breast Cancer  Practice breast self-awareness. This means understanding how your breasts normally appear and feel.  It also means doing regular breast self-exams. Let your health care provider know about any changes, no matter how small.  If you are in your 20s or 30s, you should have a clinical breast exam (CBE) by a health care provider every 1-3 years as part of a regular health exam.  If you are 34 or older, have a CBE every year. Also consider having a breast X-ray (mammogram) every year.  If you have a family history of breast cancer, talk to your health care provider about genetic screening.  If you are at high risk for breast cancer, talk  to your health care provider about having an MRI and a mammogram every year.  Breast cancer gene (BRCA) assessment is recommended for women who have family members with BRCA-related cancers. BRCA-related cancers include:  Breast.  Ovarian.  Tubal.  Peritoneal cancers.  Results of the assessment will determine the need for genetic counseling and BRCA1 and BRCA2 testing. Cervical Cancer  Your health care provider may recommend that you be screened regularly for cancer of the pelvic organs (ovaries, uterus, and vagina).  This screening involves a pelvic examination, including checking for microscopic changes to the surface of your cervix (Pap test). You may be encouraged to have this screening done every 3 years, beginning at age 24.  For women ages 66-65, health care providers may recommend pelvic exams and Pap testing every 3 years, or they may recommend the Pap and pelvic exam, combined with testing for human papilloma virus (HPV), every 5 years. Some types of HPV increase your risk of cervical cancer. Testing for HPV may also be done on women of any age with unclear Pap test results.  Other health care providers may not recommend any screening for nonpregnant women who are considered low risk for pelvic cancer and who do not have symptoms. Ask your health care provider if a screening pelvic exam is right for you.  If you have had past treatment for cervical cancer or a condition that could lead to cancer, you need Pap tests and screening for cancer for at least 20 years after your treatment. If Pap tests have been discontinued, your risk factors (such as having a new sexual partner) need to be reassessed to determine if screening should resume. Some women have medical problems that increase the chance of getting cervical cancer. In these cases, your health care provider may recommend more frequent screening and Pap tests. Colorectal Cancer  This type of cancer can be detected and often prevented.  Routine colorectal cancer screening usually begins at 45 years of age and continues through 45 years of age.  Your health care provider may recommend screening at an earlier age if you have risk factors for colon cancer.  Your health care provider may also recommend using home test kits to check for hidden blood in the stool.  A small camera at the end of a tube can be used to examine your colon directly (sigmoidoscopy or colonoscopy). This is done to check for the earliest forms of colorectal cancer.  Routine  screening usually begins at age 41.  Direct examination of the colon should be repeated every 5-10 years through 45 years of age. However, you may need to be screened more often if early forms of precancerous polyps or small growths are found. Skin Cancer  Check your skin from head to toe regularly.  Tell your health care provider about any new moles or changes in moles, especially if there is a change in a mole's shape or color.  Also tell your health care provider if you have a mole that is larger than the size of a pencil eraser.  Always use sunscreen. Apply sunscreen liberally and repeatedly throughout the day.  Protect yourself by wearing long sleeves, pants, a wide-brimmed hat, and sunglasses whenever you are outside. Heart disease, diabetes, and high blood pressure  High blood pressure causes heart disease and increases the risk of stroke. High blood pressure is more likely to develop in:  People who have blood pressure in the high end of the normal range (130-139/85-89 mm Hg).  People who are overweight or obese.  People who are African American.  If you are 59-24 years of age, have your blood pressure checked every 3-5 years. If you are 34 years of age or older, have your blood pressure checked every year. You should have your blood pressure measured twice-once when you are at a hospital or clinic, and once when you are not at a hospital or clinic. Record the average of the two measurements. To check your blood pressure when you are not at a hospital or clinic, you can use:  An automated blood pressure machine at a pharmacy.  A home blood pressure monitor.  If you are between 29 years and 60 years old, ask your health care provider if you should take aspirin to prevent strokes.  Have regular diabetes screenings. This involves taking a blood sample to check your fasting blood sugar level.  If you are at a normal weight and have a low risk for diabetes, have this test once  every three years after 45 years of age.  If you are overweight and have a high risk for diabetes, consider being tested at a younger age or more often. Preventing infection Hepatitis B  If you have a higher risk for hepatitis B, you should be screened for this virus. You are considered at high risk for hepatitis B if:  You were born in a country where hepatitis B is common. Ask your health care provider which countries are considered high risk.  Your parents were born in a high-risk country, and you have not been immunized against hepatitis B (hepatitis B vaccine).  You have HIV or AIDS.  You use needles to inject street drugs.  You live with someone who has hepatitis B.  You have had sex with someone who has hepatitis B.  You get hemodialysis treatment.  You take certain medicines for conditions, including cancer, organ transplantation, and autoimmune conditions. Hepatitis C  Blood testing is recommended for:  Everyone born from [birth year] through 1964.  Anyone with known risk factors for hepatitis C. Sexually transmitted infections (STIs)  You should be screened for sexually transmitted infections (STIs) including gonorrhea and chlamydia if:  You are sexually active and are younger than 45 years of age.  You are older than 45 years of age and your health care provider tells you that you are at risk for this type of infection.  Your sexual activity has changed since you were last screened and you are at an increased risk for chlamydia or gonorrhea. Ask your health care provider if you are at risk.  If you do not have HIV, but are at risk, it may be recommended that you take a prescription medicine daily to prevent HIV infection. This is called pre-exposure prophylaxis (PrEP). You are considered at risk if:  You are sexually active and do not regularly use condoms or know the HIV status of your partner(s).  You take drugs by injection.  You are sexually active with a partner  who has HIV. Talk with your health care provider about whether you are at high risk of being infected with HIV. If you choose to begin PrEP, you should first be tested for HIV. You should then be tested every 3 months for as long as you are taking PrEP. Pregnancy  If you are premenopausal and you may become pregnant, ask your health care provider about preconception counseling.  If you may become pregnant, take 400 to 800 micrograms (mcg) of folic acid  every day.  If you want to prevent pregnancy, talk to your health care provider about birth control (contraception). Osteoporosis and menopause  Osteoporosis is a disease in which the bones lose minerals and strength with aging. This can result in serious bone fractures. Your risk for osteoporosis can be identified using a bone density scan.  If you are 4 years of age or older, or if you are at risk for osteoporosis and fractures, ask your health care provider if you should be screened.  Ask your health care provider whether you should take a calcium or vitamin D supplement to lower your risk for osteoporosis.  Menopause may have certain physical symptoms and risks.  Hormone replacement therapy may reduce some of these symptoms and risks. Talk to your health care provider about whether hormone replacement therapy is right for you. Follow these instructions at home:  Schedule regular health, dental, and eye exams.  Stay current with your immunizations.  Do not use any tobacco products including cigarettes, chewing tobacco, or electronic cigarettes.  If you are pregnant, do not drink alcohol.  If you are breastfeeding, limit how much and how often you drink alcohol.  Limit alcohol intake to no more than 1 drink per day for nonpregnant women. One drink equals 12 ounces of beer, 5 ounces of wine, or 1 ounces of hard liquor.  Do not use street drugs.  Do not share needles.  Ask your health care provider for help if you need support  or information about quitting drugs.  Tell your health care provider if you often feel depressed.  Tell your health care provider if you have ever been abused or do not feel safe at home. This information is not intended to replace advice given to you by your health care provider. Make sure you discuss any questions you have with your health care provider. Document Released: 09/08/2010 Document Revised: 08/01/2015 Document Reviewed: 11/27/2014 Elsevier Interactive Patient Education  2017 [employer].

## 2016-05-18 NOTE — Progress Notes (Signed)
Cynthia HillierIan McKeag, MD, MS Phone: [phone]  Subjective:  CC -- Annual Physical;   Pt reports she has been doing well. She continues to have some occasional chest pain but feels as though the symptoms are regularly caused by anxiety. She feels that most of her concern regarding this issue has resolved although her symptoms persist. Patient is not interested in medication management for this at this time as she is very open about her lack of desire to take any medications.   Cardiovascular: - Dx Hypertension: no  - Dx Hyperlipidemia: no  - Dx Obesity: yes, Class I - Physical Activity: yes  - Diabetes: no  Cancer: Colorectal >> Colonoscopy: no  Lung >> Tobacco Use: no  Breast >> Mammogram: no Cervical/Endometrial >>  - Postmenopausal: no  - Vaginal Bleeding: no - Pap Smear: yes   - Previous Abnormal Pap: yes, then resolved  Skin >> Suspicious lesions: no   Social: Alcohol Use: no  Tobacco Use: no  Other Drugs: no  Risky Sexual Behavior: no  Depression: no  Support and Life at Home: no   Other: Osteoporosis: no Zoster Vaccine: no Flu Vaccine: no  Pneumonia Vaccine: no   ROS- denies recent fever, dizziness, headache, blurred vision, dysphasia, shortness of breath, vomiting, diarrhea, illness, numbness, paresthesias, or diaphoresis.  Past Medical History Patient Active Problem List   Diagnosis Date Noted  . Eczema 05/18/2016  . Chest pain 04/25/2016  . Iron deficiency anemia 11/20/2013  . Fatigue 11/15/2013  . Healthcare maintenance 11/15/2013  . EXOSTOSIS 01/25/2009  . 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 albuterol (PROVENTIL HFA;VENTOLIN HFA) 108 (90 Base) MCG/ACT inhaler Inhale 2 puffs into the lungs every 4 (four) hours as needed for shortness of breath (cough). (Patient not taking: Reported on 06/14/2015) 1 Inhaler 0  . loratadine (CLARITIN) 10 MG tablet TAKE 1 TABLET BY MOUTH  EVERY DAY 30 tablet 1  . triamcinolone ointment (KENALOG) 0.1 % Apply 1 application topically 2 (two) times daily. 453.6 g 1   No current facility-administered medications for this visit.     Objective: BP 134/76   Pulse 70   Temp 98.4 F (36.9 C) (Oral)   Ht 5\' 5"  (1.651 m)   Wt 183 lb 3.2 oz (83.1 kg)   SpO2 99%   BMI 30.49 kg/m  Gen: NAD, alert, cooperative with exam HEENT: NCAT, EOMI, PERRL CV: RRR, good S1/S2, no murmur Resp: CTABL, no wheezes, non-labored Abd: Soft, Non Tender, Non Distended, BS present, no guarding or organomegaly Genital Exam: Vulva: normal appearing vulva with no masses, tenderness or lesions Vagina: normal appearing vagina with normal color and discharge, no lesions Cervix: cervical discharge present - white, copious and creamy, cervical motion tenderness absent and lesions absent. IUD strings noted. Ext: No edema, warm Neuro: Alert and oriented, No gross deficits   Assessment/Plan:  Healthcare maintenance Patient is here for her annual physical. She endorses some anxiety but has no desire to start any medications at this time. She also endorses some mild uterine cramping. No recent discharge. No fevers. No vaginal bleeding. However, on pelvic exam there was notable cervical discharge present. I have asked that specimen be assessed for GC/chlamydia as well as typical epithelial path. - Obtain labs - Pap smear - GC/Chlamydia   Orders Placed This Encounter  Procedures  . BASIC METABOLIC PANEL WITH GFR  . CBC  . Lipid panel    Meds ordered this encounter  Medications  . triamcinolone ointment (KENALOG) 0.1 %    Sig: Apply 1 application topically 2 (two) times daily.    Dispense:  453.6 g    Refill:  1     Kathee Delton, MD,MS,  PGY3 05/18/2016 11:55 AM

## 2016-05-21 LAB — CYTOLOGY - PAP
ADEQUACY: ABSENT — AB
CHLAMYDIA, DNA PROBE: NEGATIVE
DIAGNOSIS: UNDETERMINED — AB
HPV (WINDOPATH): NOT DETECTED
Neisseria Gonorrhea: NEGATIVE

## 2016-06-01 ENCOUNTER — Telehealth: Payer: Self-pay | Admitting: Family Medicine

## 2016-06-01 NOTE — Telephone Encounter (Signed)
Patient had CPE with Dr. Wende MottMcKeag on 05/18/16 and is requesting her labs and pap results. Patient's number is [phone]. Please advise.

## 2016-06-01 NOTE — Telephone Encounter (Signed)
Abnormal results, will forward to PCP.

## 2016-06-03 ENCOUNTER — Encounter: Payer: Self-pay | Admitting: Family Medicine

## 2016-06-03 DIAGNOSIS — R87619 Unspecified abnormal cytological findings in specimens from cervix uteri: Secondary | ICD-10-CM | POA: Insufficient documentation

## 2016-06-03 NOTE — Telephone Encounter (Signed)
Called and discussed w/pt

## 2016-10-04 ENCOUNTER — Telehealth: Payer: Self-pay | Admitting: Family Medicine

## 2016-10-04 NOTE — Telephone Encounter (Signed)
**  After Hours/ Emergency Line Call*  Received a call to report that Cynthia Greene was going to a class this morning when she noticed her eye itching, she notes a history of allergies and also states she has long fingernails. She rubbed/scratched her eye, and subsequently noted a "blood spot" in her eye. Denies HA prior to this, but now with HA behind her eye, eyebrow. HA subsided now, feels like sinus pressure, which is typical for her. Red spot in her eye in conjunctiva from pupil to inner epicanthus per her report. No changes in vision. BP 153/100 > 131/93 when she checked at [facility].  Recommended that she continue to monitor HA and recheck BP if HA worsening. Recommended urgent care vs ED if vision changes or BP continued to be elevated. Patient curious about ways to help her eye, which waters frequently. Recommended she call Monday and make an appt to see her PCP to discuss allergies and recheck BP.  Red flags discussed.  Will forward to PCP.  [name], MD PGY-2, [facility]

## 2016-10-30 ENCOUNTER — Other Ambulatory Visit: Payer: Self-pay | Admitting: *Deleted

## 2016-11-02 MED ORDER — LORATADINE 10 MG PO TABS: 10.0000 mg | ORAL_TABLET | Freq: Every day | ORAL | 1 refills | Status: DC

## 2016-11-03 ENCOUNTER — Other Ambulatory Visit: Payer: Self-pay | Admitting: *Deleted

## 2016-11-04 MED ORDER — TRIAMCINOLONE ACETONIDE 0.1 % EX OINT: 1.0000 "application " | TOPICAL_OINTMENT | Freq: Two times a day (BID) | CUTANEOUS | 1 refills | Status: AC

## 2016-12-28 ENCOUNTER — Ambulatory Visit: Payer: BLUE CROSS/BLUE SHIELD | Admitting: Family Medicine

## 2017-02-09 ENCOUNTER — Other Ambulatory Visit: Payer: Self-pay | Admitting: Family Medicine

## 2017-02-26 ENCOUNTER — Other Ambulatory Visit: Payer: Self-pay

## 2017-02-26 ENCOUNTER — Encounter: Payer: Self-pay | Admitting: Family Medicine

## 2017-02-26 ENCOUNTER — Ambulatory Visit: Payer: BLUE CROSS/BLUE SHIELD | Admitting: Family Medicine

## 2017-02-26 ENCOUNTER — Other Ambulatory Visit (HOSPITAL_COMMUNITY)
Admission: RE | Admit: 2017-02-26 | Discharge: 2017-02-26 | Disposition: A | Discharge status: Home / Self Care | Payer: BLUE CROSS/BLUE SHIELD | Source: Ambulatory Visit | Attending: Family Medicine | Admitting: Family Medicine

## 2017-02-26 VITALS — BP 110/71 | HR 87 | Temp 98.2°F | Wt 180.0 lb

## 2017-02-26 DIAGNOSIS — Z202 Contact with and (suspected) exposure to infections with a predominantly sexual mode of transmission: Secondary | ICD-10-CM | POA: Diagnosis not present

## 2017-02-26 DIAGNOSIS — N760 Acute vaginitis: Secondary | ICD-10-CM | POA: Insufficient documentation

## 2017-02-26 DIAGNOSIS — B9689 Other specified bacterial agents as the cause of diseases classified elsewhere: Secondary | ICD-10-CM | POA: Insufficient documentation

## 2017-02-26 DIAGNOSIS — R399 Unspecified symptoms and signs involving the genitourinary system: Secondary | ICD-10-CM | POA: Diagnosis not present

## 2017-02-26 LAB — POCT URINALYSIS DIP (MANUAL ENTRY)
BILIRUBIN UA: NEGATIVE
GLUCOSE UA: NEGATIVE mg/dL
Ketones, POC UA: NEGATIVE mg/dL
Leukocytes, UA: NEGATIVE
NITRITE UA: NEGATIVE
Protein Ur, POC: NEGATIVE mg/dL
Spec Grav, UA: 1.02 (ref 1.010–1.025)
UROBILINOGEN UA: 0.2 U/dL
pH, UA: 7 (ref 5.0–8.0)

## 2017-02-26 LAB — POCT WET PREP (WET MOUNT)
CLUE CELLS WET PREP WHIFF POC: POSITIVE
Trichomonas Wet Prep HPF POC: ABSENT

## 2017-02-26 MED ORDER — METRONIDAZOLE 500 MG PO TABS: 500.0000 mg | ORAL_TABLET | Freq: Two times a day (BID) | ORAL | 0 refills | Status: AC

## 2017-02-26 NOTE — Patient Instructions (Addendum)
It was good to see you today!  We are checking some labs today. If results require attention, either myself or my nurse will get in touch with you. If everything is normal, you will get a letter in the mail or a message in My Chart. Please give us a call if you do not hear from us after 2 weeks.   Please bring all of your medications with you to each visit.   Sign up for My Chart to have easy access to your labs results, and communication with your primary care physician.  Feel free to call with any questions or concerns at any time, at [phone].   Take care,  Dr. Leland HerElsia J Mikeya Tomasetti, DO [facility]   Bacterial Vaginosis Bacterial vaginosis is a vaginal infection that occurs when the normal balance of bacteria in the vagina is disrupted. It results from an overgrowth of certain bacteria. This is the most common vaginal infection among women ages 7015-44. Because bacterial vaginosis increases your risk for STIs (sexually transmitted infections), getting treated can help reduce your risk for chlamydia, gonorrhea, herpes, and HIV (human immunodeficiency virus). Treatment is also important for preventing complications in pregnant women, because this condition can cause an early (premature) delivery. What are the causes? This condition is caused by an increase in harmful bacteria that are normally present in small amounts in the vagina. However, the reason that the condition develops is not fully understood. What increases the risk? The following factors may make you more likely to develop this condition:  Having a new sexual partner or multiple sexual partners.  Having unprotected sex.  Douching.  Having an intrauterine device (IUD).  Smoking.  Drug and alcohol abuse.  Taking certain antibiotic medicines.  Being pregnant.  You cannot get bacterial vaginosis from toilet seats, bedding, swimming pools, or contact with objects around you. What are the signs or  symptoms? Symptoms of this condition include:  Grey or white vaginal discharge. The discharge can also be watery or foamy.  A fish-like odor with discharge, especially after sexual intercourse or during menstruation.  Itching in and around the vagina.  Burning or pain with urination.  Some women with bacterial vaginosis have no signs or symptoms. How is this diagnosed? This condition is diagnosed based on:  Your medical history.  A physical exam of the vagina.  Testing a sample of vaginal fluid under a microscope to look for a large amount of bad bacteria or abnormal cells. Your health care provider may use a cotton swab or a small wooden spatula to collect the sample.  How is this treated? This condition is treated with antibiotics. These may be given as a pill, a vaginal cream, or a medicine that is put into the vagina (suppository). If the condition comes back after treatment, a second round of antibiotics may be needed. Follow these instructions at home: Medicines  Take over-the-counter and prescription medicines only as told by your health care provider.  Take or use your antibiotic as told by your health care provider. Do not stop taking or using the antibiotic even if you start to feel better. General instructions  If you have a female sexual partner, tell her that you have a vaginal infection. She should see her health care provider and be treated if she has symptoms. If you have a female sexual partner, he does not need treatment.  During treatment: ? Avoid sexual activity until you finish treatment. ? Do not douche. ? Avoid alcohol  as directed by your health care provider. ? Avoid breastfeeding as directed by your health care provider.  Drink enough water and fluids to keep your urine clear or pale yellow.  Keep the area around your vagina and rectum clean. ? Wash the area daily with warm water. ? Wipe yourself from front to back after using the toilet.  Keep all  follow-up visits as told by your health care provider. This is important. How is this prevented?  Do not douche.  Wash the outside of your vagina with warm water only.  Use protection when having sex. This includes latex condoms and dental dams.  Limit how many sexual partners you have. To help prevent bacterial vaginosis, it is best to have sex with just one partner (monogamous).  Make sure you and your sexual partner are tested for STIs.  Wear cotton or cotton-lined underwear.  Avoid wearing tight pants and pantyhose, especially during summer.  Limit the amount of alcohol that you drink.  Do not use any products that contain nicotine or tobacco, such as cigarettes and e-cigarettes. If you need help quitting, ask your health care provider.  Do not use illegal drugs. Where to find more information:  Centers for Disease Control and Prevention: SolutionApps.co.zawww.cdc.gov/std  American Sexual Health Association (ASHA): www.ashastd.org  U.S. Department of Health and Health and safety inspectorHuman Services, Office on Women's Health: ConventionalMedicines.siwww.womenshealth.gov/ or http://www.anderson-williamson.info/https://www.womenshealth.gov/a-z-topics/bacterial-vaginosis Contact a health care provider if:  Your symptoms do not improve, even after treatment.  You have more discharge or pain when urinating.  You have a fever.  You have pain in your abdomen.  You have pain during sex.  You have vaginal bleeding between periods. Summary  Bacterial vaginosis is a vaginal infection that occurs when the normal balance of bacteria in the vagina is disrupted.  Because bacterial vaginosis increases your risk for STIs (sexually transmitted infections), getting treated can help reduce your risk for chlamydia, gonorrhea, herpes, and HIV (human immunodeficiency virus). Treatment is also important for preventing complications in pregnant women, because the condition can cause an early (premature) delivery.  This condition is treated with antibiotic medicines. These may be given as a  pill, a vaginal cream, or a medicine that is put into the vagina (suppository). This information is not intended to replace advice given to you by your health care provider. Make sure you discuss any questions you have with your health care provider. Document Released: 02/23/2005 Document Revised: 06/29/2016 Document Reviewed: 11/09/2015 Elsevier Interactive Patient Education  [employer]2018 [employer]

## 2017-02-26 NOTE — Assessment & Plan Note (Signed)
Treat with metronidazole. GC/CT, HIV, RPR collected today.

## 2017-02-26 NOTE — Progress Notes (Signed)
    Subjective:  Cynthia Greene is a 46 y.o. female who [facility] with a chief complaint of vaginal odor.   HPI:  Vaginal odor on and off for the past few months, tried to self manage at home with good hydration and eating yogurt. Smell was especially pungent over the last week after which she got her cycle, last day was Wednesday.  No urinary symptoms No confirmed STD exposure but she is sexually active and wants to be checked for everything today.   ROS: Per HPI  Objective:  Physical Exam: BP 110/71   Pulse 87   Temp 98.2 F (36.8 C) (Oral)   Wt 180 lb (81.6 kg)   LMP 02/20/2017   SpO2 96%   BMI 29.95 kg/m   Gen: NAD, resting comfortably GI: Normal bowel sounds present. Soft, Nontender, Nondistended. Pelvic exam: normal external genitalia, vulva, vagina, cervix, uterus and adnexa, IUD strings visualized. No CMT.  MSK: no edema, cyanosis, or clubbing noted Skin: warm, dry Neuro: grossly normal, moves all extremities Psych: Normal affect and thought content  Results for orders placed or performed in visit on 02/26/17 (from the past 72 hour(s))  POCT urinalysis dipstick     Status: Abnormal   Collection Time: 02/26/17 11:08 AM  Result Value Ref Range   Color, UA yellow yellow   Clarity, UA clear clear   Glucose, UA negative negative mg/dL   Bilirubin, UA negative negative   Ketones, POC UA negative negative mg/dL   Spec Grav, UA [id number] - 1.025   Blood, UA trace-intact (A) negative   pH, UA 7.0 5.0 - 8.0   Protein Ur, POC negative negative mg/dL   Urobilinogen, UA 0.2 0.2 or 1.0 E.U./dL   Nitrite, UA Negative Negative   Leukocytes, UA Negative Negative  POCT Wet Prep Mellody Drown([place])     Status: Abnormal   Collection Time: 02/26/17 11:54 AM  Result Value Ref Range   Source Wet Prep POC VAG    WBC, Wet Prep HPF POC 1-5    Bacteria Wet Prep HPF POC Many (A) Few   Clue Cells Wet Prep HPF POC Moderate (A) None   Clue Cells Wet Prep Whiff POC  Positive Whiff    Yeast Wet Prep HPF POC None    Trichomonas Wet Prep HPF POC Absent Absent     Assessment/Plan:  Bacterial vaginosis Treat with metronidazole. GC/CT, HIV, RPR collected today.    Leland HerElsia J Genever Hentges, DO PGY-2, [facility] 02/26/2017 11:41 AM

## 2017-02-27 LAB — HIV ANTIBODY (ROUTINE TESTING W REFLEX): HIV Screen 4th Generation wRfx: NONREACTIVE

## 2017-02-27 LAB — RPR: RPR: NONREACTIVE

## 2017-03-01 LAB — CERVICOVAGINAL ANCILLARY ONLY
CHLAMYDIA, DNA PROBE: NEGATIVE
NEISSERIA GONORRHEA: NEGATIVE

## 2017-03-03 ENCOUNTER — Encounter: Payer: Self-pay | Admitting: Family Medicine

## 2017-03-03 ENCOUNTER — Telehealth: Payer: Self-pay | Admitting: *Deleted

## 2017-03-03 ENCOUNTER — Encounter: Payer: Self-pay | Admitting: *Deleted

## 2017-03-03 NOTE — Telephone Encounter (Signed)
-----   Message from Leland HerElsia J Yoo, DO sent at 03/03/2017  9:58 AM EST ----- Can patient please be called and informed of negative results

## 2017-03-03 NOTE — Telephone Encounter (Signed)
Letter mailed to patient. Jazmin Hartsell,CMA  

## 2017-03-16 ENCOUNTER — Encounter: Payer: Self-pay | Admitting: Family Medicine

## 2018-05-30 ENCOUNTER — Other Ambulatory Visit: Payer: Self-pay | Admitting: *Deleted

## 2018-05-30 MED ORDER — LORATADINE 10 MG PO TABS: 10.0000 mg | ORAL_TABLET | Freq: Every day | ORAL | 12 refills | Status: AC
